# Patient Record
Sex: Male | Born: 1960 | Race: White | Hispanic: No | Marital: Married | Smoking: Current every day smoker
Health system: Southern US, Community
[De-identification: ages and names within clinical notes are randomized; demographics above are authoritative.]

## PROBLEM LIST (undated history)

## (undated) DIAGNOSIS — I10 Essential (primary) hypertension: Secondary | ICD-10-CM

## (undated) DIAGNOSIS — Z720 Tobacco use: Secondary | ICD-10-CM

## (undated) DIAGNOSIS — R002 Palpitations: Secondary | ICD-10-CM

## (undated) DIAGNOSIS — F32A Depression, unspecified: Secondary | ICD-10-CM

## (undated) DIAGNOSIS — I779 Disorder of arteries and arterioles, unspecified: Secondary | ICD-10-CM

## (undated) DIAGNOSIS — Z86711 Personal history of pulmonary embolism: Secondary | ICD-10-CM

## (undated) DIAGNOSIS — E785 Hyperlipidemia, unspecified: Secondary | ICD-10-CM

## (undated) DIAGNOSIS — Z9289 Personal history of other medical treatment: Secondary | ICD-10-CM

## (undated) DIAGNOSIS — I739 Peripheral vascular disease, unspecified: Secondary | ICD-10-CM

## (undated) DIAGNOSIS — I2510 Atherosclerotic heart disease of native coronary artery without angina pectoris: Secondary | ICD-10-CM

## (undated) DIAGNOSIS — F329 Major depressive disorder, single episode, unspecified: Secondary | ICD-10-CM

## (undated) HISTORY — DX: Personal history of other medical treatment: Z92.89

## (undated) HISTORY — DX: Peripheral vascular disease, unspecified: I73.9

## (undated) HISTORY — DX: Palpitations: R00.2

## (undated) HISTORY — DX: Disorder of arteries and arterioles, unspecified: I77.9

## (undated) HISTORY — DX: Atherosclerotic heart disease of native coronary artery without angina pectoris: I25.10

## (undated) HISTORY — PX: OTHER SURGICAL HISTORY: SHX169

## (undated) HISTORY — DX: Tobacco use: Z72.0

## (undated) HISTORY — DX: Personal history of pulmonary embolism: Z86.711

## (undated) HISTORY — DX: Depression, unspecified: F32.A

## (undated) HISTORY — PX: HIP SURGERY: SHX245

## (undated) HISTORY — DX: Hyperlipidemia, unspecified: E78.5

## (undated) HISTORY — DX: Essential (primary) hypertension: I10

## (undated) HISTORY — DX: Major depressive disorder, single episode, unspecified: F32.9

---

## 2006-01-10 ENCOUNTER — Emergency Department (HOSPITAL_COMMUNITY): Admission: EM | Admit: 2006-01-10 | Discharge: 2006-01-10 | Payer: Self-pay | Admitting: Emergency Medicine

## 2006-11-07 ENCOUNTER — Emergency Department (HOSPITAL_COMMUNITY): Admission: EM | Admit: 2006-11-07 | Discharge: 2006-11-07 | Payer: Self-pay | Admitting: *Deleted

## 2007-08-13 DIAGNOSIS — Z86711 Personal history of pulmonary embolism: Secondary | ICD-10-CM

## 2007-08-13 HISTORY — DX: Personal history of pulmonary embolism: Z86.711

## 2008-03-27 ENCOUNTER — Inpatient Hospital Stay (HOSPITAL_COMMUNITY): Admission: EM | Admit: 2008-03-27 | Discharge: 2008-03-29 | Payer: Self-pay | Admitting: Family Medicine

## 2008-03-27 ENCOUNTER — Encounter: Payer: Self-pay | Admitting: Emergency Medicine

## 2008-03-27 ENCOUNTER — Ambulatory Visit: Payer: Self-pay | Admitting: Family Medicine

## 2008-03-28 ENCOUNTER — Encounter: Payer: Self-pay | Admitting: Family Medicine

## 2008-03-28 ENCOUNTER — Ambulatory Visit: Payer: Self-pay | Admitting: Vascular Surgery

## 2008-03-30 ENCOUNTER — Emergency Department (HOSPITAL_COMMUNITY): Admission: EM | Admit: 2008-03-30 | Discharge: 2008-03-30 | Payer: Self-pay | Admitting: Emergency Medicine

## 2009-07-19 ENCOUNTER — Ambulatory Visit: Payer: Self-pay | Admitting: Internal Medicine

## 2009-07-19 ENCOUNTER — Inpatient Hospital Stay (HOSPITAL_COMMUNITY): Admission: EM | Admit: 2009-07-19 | Discharge: 2009-07-21 | Payer: Self-pay | Admitting: Emergency Medicine

## 2009-07-28 ENCOUNTER — Telehealth: Payer: Self-pay | Admitting: Cardiology

## 2009-08-01 ENCOUNTER — Telehealth (INDEPENDENT_AMBULATORY_CARE_PROVIDER_SITE_OTHER): Payer: Self-pay | Admitting: *Deleted

## 2009-08-07 ENCOUNTER — Encounter: Payer: Self-pay | Admitting: Cardiology

## 2009-08-09 ENCOUNTER — Encounter: Payer: Self-pay | Admitting: Cardiology

## 2009-08-16 ENCOUNTER — Ambulatory Visit: Payer: Self-pay | Admitting: Cardiology

## 2009-08-16 DIAGNOSIS — I25118 Atherosclerotic heart disease of native coronary artery with other forms of angina pectoris: Secondary | ICD-10-CM | POA: Insufficient documentation

## 2009-08-24 ENCOUNTER — Telehealth: Payer: Self-pay | Admitting: Cardiology

## 2009-09-18 ENCOUNTER — Ambulatory Visit: Payer: Self-pay | Admitting: Cardiology

## 2009-09-18 ENCOUNTER — Encounter: Payer: Self-pay | Admitting: Cardiology

## 2009-09-18 ENCOUNTER — Ambulatory Visit (HOSPITAL_COMMUNITY): Admission: RE | Admit: 2009-09-18 | Discharge: 2009-09-18 | Payer: Self-pay | Admitting: Cardiology

## 2009-09-18 ENCOUNTER — Ambulatory Visit: Payer: Self-pay

## 2009-09-26 LAB — CONVERTED CEMR LAB
ALT: 27 units/L (ref 0–53)
AST: 20 units/L (ref 0–37)
Albumin: 4.1 g/dL (ref 3.5–5.2)
BUN: 15 mg/dL (ref 6–23)
Bilirubin, Direct: 0 mg/dL (ref 0.0–0.3)
Calcium: 9.2 mg/dL (ref 8.4–10.5)
Sodium: 142 meq/L (ref 135–145)
Total CHOL/HDL Ratio: 3

## 2009-10-30 ENCOUNTER — Telehealth (INDEPENDENT_AMBULATORY_CARE_PROVIDER_SITE_OTHER): Payer: Self-pay | Admitting: *Deleted

## 2009-11-06 ENCOUNTER — Ambulatory Visit: Payer: Self-pay | Admitting: Cardiology

## 2009-11-07 ENCOUNTER — Telehealth: Payer: Self-pay | Admitting: Cardiology

## 2009-11-07 ENCOUNTER — Encounter: Payer: Self-pay | Admitting: Cardiology

## 2009-11-09 ENCOUNTER — Telehealth: Payer: Self-pay | Admitting: Cardiology

## 2009-11-16 ENCOUNTER — Telehealth: Payer: Self-pay | Admitting: Cardiology

## 2009-11-21 ENCOUNTER — Telehealth: Payer: Self-pay | Admitting: Cardiology

## 2009-11-22 ENCOUNTER — Telehealth (INDEPENDENT_AMBULATORY_CARE_PROVIDER_SITE_OTHER): Payer: Self-pay | Admitting: *Deleted

## 2009-12-06 ENCOUNTER — Telehealth (INDEPENDENT_AMBULATORY_CARE_PROVIDER_SITE_OTHER): Payer: Self-pay | Admitting: *Deleted

## 2009-12-07 ENCOUNTER — Telehealth: Payer: Self-pay | Admitting: Cardiology

## 2009-12-11 ENCOUNTER — Encounter: Payer: Self-pay | Admitting: Cardiology

## 2009-12-11 ENCOUNTER — Telehealth (INDEPENDENT_AMBULATORY_CARE_PROVIDER_SITE_OTHER): Payer: Self-pay | Admitting: *Deleted

## 2009-12-13 ENCOUNTER — Telehealth: Payer: Self-pay | Admitting: Cardiology

## 2009-12-28 ENCOUNTER — Ambulatory Visit: Payer: Self-pay | Admitting: Cardiology

## 2010-04-05 ENCOUNTER — Telehealth: Payer: Self-pay | Admitting: Cardiology

## 2010-04-05 ENCOUNTER — Telehealth (INDEPENDENT_AMBULATORY_CARE_PROVIDER_SITE_OTHER): Payer: Self-pay | Admitting: *Deleted

## 2010-04-05 DIAGNOSIS — R079 Chest pain, unspecified: Secondary | ICD-10-CM

## 2010-04-09 ENCOUNTER — Encounter: Payer: Self-pay | Admitting: Cardiology

## 2010-04-09 ENCOUNTER — Telehealth: Payer: Self-pay | Admitting: Cardiology

## 2010-04-09 ENCOUNTER — Ambulatory Visit: Payer: Self-pay

## 2010-04-09 ENCOUNTER — Ambulatory Visit: Payer: Self-pay | Admitting: Cardiology

## 2010-04-09 ENCOUNTER — Encounter (HOSPITAL_COMMUNITY): Admission: RE | Admit: 2010-04-09 | Discharge: 2010-05-03 | Payer: Self-pay | Admitting: Cardiology

## 2010-04-09 LAB — CONVERTED CEMR LAB
BUN: 17 mg/dL (ref 6–23)
Chloride: 105 meq/L (ref 96–112)
Eosinophils Absolute: 0.3 10*3/uL (ref 0.0–0.7)
Eosinophils Relative: 2 % (ref 0.0–5.0)
GFR calc non Af Amer: 102.57 mL/min (ref >60)
INR: 0.9 (ref 0.8–1.0)
Lymphocytes Relative: 15.4 % (ref 12.0–46.0)
Lymphs Abs: 1.9 10*3/uL (ref 0.7–4.0)
Monocytes Absolute: 0.8 10*3/uL (ref 0.1–1.0)
Monocytes Relative: 6.3 % (ref 3.0–12.0)
Platelets: 219 10*3/uL (ref 150.0–400.0)
RBC: 4.94 M/uL (ref 4.22–5.81)
RDW: 14.2 % (ref 11.5–14.6)
WBC: 12.6 10*3/uL — ABNORMAL HIGH (ref 4.5–10.5)

## 2010-04-10 ENCOUNTER — Telehealth (INDEPENDENT_AMBULATORY_CARE_PROVIDER_SITE_OTHER): Payer: Self-pay

## 2010-04-10 ENCOUNTER — Inpatient Hospital Stay (HOSPITAL_COMMUNITY): Admission: EM | Admit: 2010-04-10 | Discharge: 2010-04-11 | Payer: Self-pay | Admitting: Emergency Medicine

## 2010-04-10 ENCOUNTER — Ambulatory Visit: Payer: Self-pay | Admitting: Internal Medicine

## 2010-04-18 ENCOUNTER — Telehealth: Payer: Self-pay | Admitting: Cardiology

## 2010-04-24 ENCOUNTER — Encounter: Payer: Self-pay | Admitting: Cardiology

## 2010-04-24 ENCOUNTER — Telehealth (INDEPENDENT_AMBULATORY_CARE_PROVIDER_SITE_OTHER): Payer: Self-pay | Admitting: *Deleted

## 2010-04-24 ENCOUNTER — Encounter (INDEPENDENT_AMBULATORY_CARE_PROVIDER_SITE_OTHER): Payer: Self-pay | Admitting: *Deleted

## 2010-05-01 ENCOUNTER — Ambulatory Visit: Payer: Self-pay | Admitting: Cardiology

## 2010-07-30 ENCOUNTER — Ambulatory Visit: Payer: Self-pay | Admitting: Cardiology

## 2010-07-30 ENCOUNTER — Encounter: Payer: Self-pay | Admitting: Cardiology

## 2010-09-11 NOTE — Miscellaneous (Signed)
Summary: [facility] Health Care Progress Note  [facility] Health Care Progress Note   Imported By: Roderic Ovens 08/29/2009 13:19:13  _____________________________________________________________________  External Attachment:    Type:   Image     Comment:   External Document

## 2010-09-11 NOTE — Progress Notes (Signed)
Summary: returning call--return to work --pick up Monday  Phone Note Call from Patient Call back at [facility] Phone [phone]   Caller: Patient Reason for Call: Talk to Nurse Summary of Call: returning call  Initial call taken by: Migdalia Dk,  December 07, 2009 10:12 AM  Follow-up for Phone Call        0838am [facility] 1021am   Patient needs forms signed by Monday before he returns to work.  He brought them by the office on 4/27.  Pt is aware Dr. Shirlee Latch will be out of the office until Monday and that his nurse, Thurston Hole, will be in on Friday 4/29.  He said he could pick them up before going to work on Monday.  I will leave message for Thurston Hole.  Follow-up by: Lisabeth Devoid RN,  December 07, 2009 10:28 AM  Additional Follow-up for Phone Call Additional follow up Details #1::        [facility] Katina Dung, RN, BSN  December 08, 2009 8:41 AM  talked with patient--pt wants to pick up form to return to work Monday morning-pt plans to return to work Monday May 2,2011 return to work date is May 1,2011---I have forwarded form to [facility] to be completed and returned to me this afternoon at 3 PM for Dr Shirlee Latch to complete on Monday morning      Additional Follow-up for Phone Call Additional follow up Details #2::    Health Care Provider Fitness Form returned to [employer] for completion. Lenard Forth  December 08, 2009 10:37 AM   Additional Follow-up for Phone Call Additional follow up Details #3:: Details for Additional Follow-up Action Taken: Dr Shirlee Latch signed Health Care Provider Fitness Form -pt to return to work May 1,2011 without restrictions--signed form returned to medical records for pt pick up--pt aware form has been signed by Dr Shirlee Latch and is ready for pick-up in medical records

## 2010-09-11 NOTE — Letter (Signed)
Summary: Generic Letter  Architectural technologist, Main Office  [address]   [place], [place] [address]   Phone: [phone]  Fax: [phone]    12/28/2009  Jesse Page [address] [place], [place]  [address]  [employer]  Mr. Cogbill has been under my care for coronary artery disease. On 07/19/09 he was admitted to [facility] with acute coronary syndrome. On 07/20/09 had PCI of the right coronary artery  with residual high grade lesion in the LAD. Because of his significant residual CAD and the high stress and significant physical demands of his job, I recommended that he remain out of work from December 8,2010 until May 1,2011. I felt that he needed this time for recovery and rehabilitation. Please let me know if you require any further information.    Sincerely,       Dalton McLean,MD

## 2010-09-11 NOTE — Letter (Signed)
Summary: Return To Work  [employer], Main Office  [address]   [place], [place] [address]   Phone: [phone]  Fax: [phone]    08/16/2009  TO: Leodis Sias IT MAY CONCERN   RE: Jesse Page [address] [id number]   The above named individual is under my medical care and may return to work light duty on January 17,2011. He should remain on light duty until he completes Cardiac Rehab.  If you have any further questions or need additional information, please call.     Sincerely,    Dr [name]

## 2010-09-11 NOTE — Progress Notes (Signed)
Summary: Nuc Pre-Procedure  Phone Note Outgoing Call Call back at [facility] Phone [phone]   Call placed by: Antionette Char RN,  April 05, 2010 4:57 PM Call placed to: Patient Reason for Call: Confirm/change Appt Summary of Call: Reviewed information on Myoview Information Sheet (see scanned document for further details).  Spoke with patient.     Nuclear Med Background Indications for Stress Test: Evaluation for Ischemia   History: Echo, Heart Catheterization, Stents   Symptoms: Chest Pain, Palpitations    Nuclear Pre-Procedure Cardiac Risk Factors: Family History - CAD, Smoker Height (in): 71

## 2010-09-11 NOTE — Progress Notes (Signed)
Summary: call from Dr [facility]  Phone Note From Other Clinic   Caller: Dr Earlene Plater Call For: Dr Shirlee Latch Summary of Call: pt  Follow-up for Phone Call        Dr Earlene Plater from [facility] called and talked with Dr Shirlee Latch about pt--he recommended pt be scheduled for stress myoview because of chest pain--LMTCB Katina Dung, RN, BSN  April 05, 2010 12:50 PM   Pt returning call Judie Grieve  April 05, 2010 1:33 PM  Additional Follow-up for Phone Call Additional follow up Details #1::        talked with pt by telephone--reviewed myoview instructions over telephone with pt  New Problems: CHEST PAIN UNSPECIFIED (ICD-786.50)   New Problems: CHEST PAIN UNSPECIFIED (ICD-786.50)

## 2010-09-11 NOTE — Assessment & Plan Note (Signed)
Summary: Cardiology Nuclear Testing  Nuclear Med Background Indications for Stress Test: Evaluation for Ischemia, Stent Patency   History: Echo, Heart Catheterization, Stents  History Comments: Stents in RCA  Symptoms: Chest Pain, Palpitations    Nuclear Pre-Procedure Cardiac Risk Factors: Family History - CAD, Smoker Caffeine/Decaff Intake: None NPO After: 8:00 PM Lungs: clear IV 0.9% NS with Angio Cath: 18G     IV Site: R Antecubital IV Started by: Irean Hong, RN Chest Size (in) 42     Height (in): 71 Weight (lb): 190 BMI: 26.60 Tech Comments: Held coreg 24 hrs.  Nuclear Med Study 1 or 2 day study:  1 day     Stress Test Type:  Stress Reading MD:  Willa Rough, MD     Referring MD:  D.McLean,MD Resting Radionuclide:  Technetium 69m Tetrofosmin     Resting Radionuclide Dose:  11 mCi  Stress Radionuclide:  Technetium 50m Tetrofosmin     Stress Radionuclide Dose:  33 mCi   Stress Protocol Exercise Time (min):  10:16 min     Max HR:  154 bpm     Predicted Max HR:  170 bpm  Max Systolic BP: 186 mm Hg     Percent Max HR:  90.59 %     METS: 12.10 Rate Pressure Product:  72536    Stress Test Technologist:  Cathlyn Parsons, RN     Nuclear Technologist:  Domenic Polite, CNMT  Rest Procedure  Myocardial perfusion imaging was performed at rest 45 minutes following the intravenous administration of Technetium 75m Tetrofosmin.  Stress Procedure  The patient exercised for 10:16.  The patient stopped due to fatigue,SOB and chest tightness.   There were some  non specific ST-T wave changes.  Technetium 84m Tetrofosmin was injected at peak exercise and myocardial perfusion imaging was performed after a brief delay. At the start of recovery, patient  was extremely SOB, pale,lightheaded.  Patients chest tightness was 4-5/10. Rare PAC's and PVC's noted.  Patients symptoms relieved after in recovery. Nuclear study shown to Dr. Shirlee Latch.  Patient being seen by Dr. Shirlee Latch today and  will have a cardiac catherization on  04/13/10.  QPS Raw Data Images:  Patient motion noted; appropriate software correction applied. Stress Images:  Slight decrease in activity at inferior base...insignificant Rest Images:  Same as stress Subtraction (SDS):  No evidence of ischemia. Transient Ischemic Dilatation:  0.94  (Normal <1.22)  Lung/Heart Ratio:  0.35  (Normal <0.45)  Quantitative Gated Spect Images QGS EDV:  120 ml QGS ESV:  59 ml QGS EF:  51 % QGS cine images:  Normal  Findings Normal nuclear study      Overall Impression  Exercise Capacity: Good exercise capacity. BP Response: Normal blood pressure response. Clinical Symptoms: Chest tight 1/10 ECG Impression: No significant ST segment change suggestive of ischemia. Overall Impression Comments: There is no definite scar or ischemia.  Appended Document: Cardiology Nuclear Testing Reviewed raw images, perfusion defect in basal inferior wall.  Had significant chest pain with exertion lasting well into rest. Set up for outpatient cath.

## 2010-09-11 NOTE — Letter (Signed)
Summary: Generic Letter  Architectural technologist, Main Office  [address]   [place], [place] [address]   Phone: [phone]  Fax: [phone]    04/24/2010  Jesse Page [address] [place], [place]  [address]  Dear [name],  You may return to work with full duty on April 25, 2010.       Sincerely,   [name], MD  Appended Document: Generic Letter pt request letter to state return  to work September 19. Letter will be changed and left at front desk for pick-up. Claris Gladden, RN   BSN

## 2010-09-11 NOTE — Letter (Signed)
Summary: Health Care Provider Fitness Duty Form   Health Care Provider Fitness Duty Form   Imported By: Erle Crocker 12/13/2009 09:10:45  _____________________________________________________________________  External Attachment:    Type:   Image     Comment:   External Document

## 2010-09-11 NOTE — Progress Notes (Signed)
Summary: PT RTN CALL FROM YESTERDAY  Phone Note Call from Patient Call back at Home Phone [phone]   Caller: Patient Reason for Call: Talk to Nurse, Talk to Doctor Summary of Call: PT RTN CALL FROM Kristeen Mans Initial call taken by: Omer Jack,  April 10, 2010 10:43 AM  Follow-up for Phone Call        Spoke with pt. Patient states, Thurston Hole had called him back after pt. left the office yesterday. Rn read over the  pre-op instructuction with pt. which he said he had the ionstructions with him. Pt. would like for Thurston Hole to call him back if he needs to know any thing else prior the cath. Follow-up by: Ollen Gross, RN, BSN,  April 10, 2010 11:36 AM

## 2010-09-11 NOTE — Progress Notes (Signed)
Summary: Records faxed to [facility]  Faxed 12 pages to [phone], physicians statement dated 3/28, 2-D echo(09/18/09) and office visits from 08/16/09 and 11/06/09. Spoke to [place] at [phone]. Called patient to inform him that Records and the Physicians Statement dated 11/06/09 were received and is being processed. Rene Kocher Flowers  November 22, 2009 10:24 AM

## 2010-09-11 NOTE — Assessment & Plan Note (Signed)
Summary: f60m/jss   Primary Provider:  Dr. Earlene Plater, [facility]  CC:  check up.  History of Present Illness: 50 yo with h/o smoking and CAD s/p unstable angina with recent PCI to the RCA presents for followup.  Patient was admitted to [facility] in 12/10 with unstable angina-type symptoms.  He was getting chest tightness and shortness of breath with only mild exertion (walking) for a couple of weeks.  Left heart cath showed significant mid RCA stenosis and apical LAD stenosis.  He underwent PCI with Xience DES to the mid RCA.  He has been doing well symptomatically since discharge.  He is no longer going to cardiac rehab.  He has been walking 4 miles a day for exercise.  No exertional chest pain or exertional shortness of breath.  He is not back at work yet (short term disability).  Unfortunately, he is back up to smoking 1 ppd.  He stopped Imdur because of headaches but has not needed nitrates (no chest pain).   Labs (12/10): Creaitnine 1.05, K 4 Labs (2/11): K 4.1, creatinine 1.1, LDL 48, HDL 34, LFTs normal  Current Medications (verified): 1)  Aspir-Trin 325 Mg Tbec (Aspirin) .... One Daily 2)  Plavix 75 Mg Tabs (Clopidogrel Bisulfate) .... One Daily 3)  Coreg 6.25 Mg Tabs (Carvedilol) .... One Twice Daily 4)  Lipitor 40 Mg Tabs (Atorvastatin Calcium) .... One Daily 5)  Lisinopril 10 Mg Tabs (Lisinopril) .... One Daily 6)  Nicoderm Cq 14 Mg/24hr Pt24 (Nicotine) .Marland Kitchen.. 1 Patch Daily 7)  Nitrostat 0.4 Mg Subl (Nitroglycerin) .... As Needed For Chest Pain 8)  Fish Oil 1000 Mg Caps (Omega-3 Fatty Acids) .... Two Capsules Daily  Allergies: No Known Drug Allergies  Past History:  Past Medical History: 1. Ongoing tobacco use.  Using nicotine patch.  Has not tolerated Chantix or Wellbutrin.  2. History of segmental right lower lobe acute pulmonary emboli with no evidence of right heart strain and overall small clot burden.  This was in 2009.  Patient is no longer taking coumadin.  3.  Hyperlipidemia.  4. CAD: Unstable angina 12/10.  LHC showed 90% distal LAD stenosis and 90% mid RCA stenosis.  EF 55%.  Patient had Xience DES to the mid RCA.  Echo (2/11): EF 55-60%, normal wall motion, no significant valvular abnormalities.  5. Palpitations: Prior holter monitor unremarkable per patient's report.  6. HTN  Family History: Reviewed history from 08/15/2009 and no changes required.  Family history of premature coronary artery disease  Social History: Married, lives in [place].  Works as Radiation protection practitioner.  Smokes 1 ppd. Occasional ETOH.   Review of Systems       All systems reviewed and negative except as per HPI.   Vital Signs:  Patient profile:   50 year old male Height:      71 inches Weight:      199 pounds BMI:     27.86 Pulse rate:   64 / minute Resp:     12 per minute BP sitting:   138 / 82  (left arm)  Vitals Entered By: Kem Parkinson (November 06, 2009 8:58 AM)  Physical Exam  General:  Well developed, well nourished, in no acute distress. Neck:  Neck supple, no JVD. No masses, thyromegaly or abnormal cervical nodes. Lungs:  Occasional rhonchi bilaterally Heart:  Non-displaced PMI, chest non-tender; regular rate and rhythm, S1, S2 without murmurs, rubs or gallops. Carotid upstroke normal, no bruit.  Pedals normal pulses. No edema, no  varicosities. Abdomen:  Bowel sounds positive; abdomen soft and non-tender without masses, organomegaly, or hernias noted. No hepatosplenomegaly. Extremities:  No clubbing or cyanosis. Neurologic:  Alert and oriented x 3. Psych:  Normal affect.   Impression & Recommendations:  Problem # 1:  CORONARY ATHEROSCLEROSIS NATIVE CORONARY ARTERY (ICD-414.01) Patient is doing well post-PCI.  He had a Xience DES to the RCA.  He has residual distal LAD disease.  The distal LAD is not a good interventional target.  He is doing well symptomatically with no chest pain and good exercise tolerance.  Continue ASA, Plavix, Coreg, lisinopril,  statin.  EF was preserved on echocardiogram.   Problem # 2:  HYPERCHOLESTEROLEMIA (ICD-272.0) Excellent LDL, HDL a little low.  Increase activity level.   Problem # 3:  SMOKING We had a long discussion about his smoking.  He needs to quit.  He has had adverse reactions to Chantix and Wellbutrin.  He will continue nicotine patches and look into tobacco cessation classes.   Patient Instructions: 1)  Your physician recommends that you continue on your current medications as directed. Please refer to the Current Medication list given to you today. 2)  Your physician wants you to follow-up in: 6 months with Dr Shirlee Latch.  You will receive a reminder letter in the mail two months in advance. If you don't receive a letter, please call our office to schedule the follow-up appointment.

## 2010-09-11 NOTE — Letter (Signed)
Summary: [facility]   Imported By: Marylou Mccoy 08/22/2009 11:48:38  _____________________________________________________________________  External Attachment:    Type:   Image     Comment:   External Document

## 2010-09-11 NOTE — Progress Notes (Signed)
Summary: need note to return to work full time  Phone Note Call from Patient Call back at [facility] Phone [phone]   Caller: Patient Summary of Call: Pt need  note to return back to work full time Initial call taken by: Judie Grieve,  April 24, 2010 8:22 AM  Follow-up for Phone Call        Letter left at front desk for pt.  Claris Gladden, RN, BSM Follow-up by: Claris Gladden RN,  April 24, 2010 9:08 AM

## 2010-09-11 NOTE — Progress Notes (Signed)
Summary: paperwork  Phone Note From Other Clinic Call back at [phone]   Caller: [employer]  Summary of Call: req call back about paperwork claim [phone] Initial call taken by: Migdalia Dk,  November 09, 2009 2:22 PM  Follow-up for Phone Call        Thurston Hole, spoke with Jerilynn Som w/ [facility] he is now requesting: The Doctor's Physical Finding's,Current Restrictions,And if pt has had a Stress test yet.Marland KitchenPlease call Back @ [phone] with these Results... Thanks Loletta Specter Mesiemore  November 09, 2009 3:01 PM talked with Bosie Clos at [facility] claim #[id number] requesting medical records be sent to them --this information should be in the pt's chat and on the form Dr Shirlee Latch completed 10-27-09--I will forward to medical records to follow-up on this Luana Shu  per pt --additional information requested from [facility] Ins--pt to talk with medical records today about getting additional forms from [facility] Ins.  Katina Dung, RN, BSN  November 16, 2009 10:57 AM

## 2010-09-11 NOTE — Assessment & Plan Note (Signed)
Summary: rov   Primary Provider:  Dr. Earlene Plater, [facility]  CC:  rov/no cardiac concerns at this time.  Here to go over insurance paperwork. Pt needs to discuss cause for Out of Work on [employer] forms..  History of Present Illness: 50 yo with h/o smoking and CAD s/p unstable angina with recent PCI to the RCA presents for followup.  Patient was admitted to [facility] in 12/10 with unstable angina-type symptoms.  He was getting chest tightness and shortness of breath with only mild exertion (walking) for a couple of weeks.  Left heart cath showed significant mid RCA stenosis and apical LAD stenosis.  He underwent PCI with Xience DES to the mid RCA.  He has been doing well symptomatically since discharge.  He is no longer going to cardiac rehab.  He has been walking 4 miles a day for exercise.  No exertional chest pain or exertional shortness of breath.  He is not back at work yet (short term disability).  Unfortunately, he is back up to smoking 1 ppd.    Patient has had trouble getting his short term disability and has run out of his meds for the last 3 weeks but did not tell us.  He is only taking aspirin.  BP is 154/89.   Labs (12/10): Creaitnine 1.05, K 4 Labs (2/11): K 4.1, creatinine 1.1, LDL 48, HDL 34, LFTs normal  Current Medications (verified): 1)  Aspir-Trin 325 Mg Tbec (Aspirin) .... One Daily 2)  Plavix 75 Mg Tabs (Clopidogrel Bisulfate) .... One Daily 3)  Coreg 6.25 Mg Tabs (Carvedilol) .... One Twice Daily 4)  Lipitor 40 Mg Tabs (Atorvastatin Calcium) .... One Daily 5)  Lisinopril 10 Mg Tabs (Lisinopril) .... One Daily 6)  Nitrostat 0.4 Mg Subl (Nitroglycerin) .... As Needed For Chest Pain 7)  Fish Oil 1000 Mg Caps (Omega-3 Fatty Acids) .... Two Capsules Daily  Allergies (verified): No Known Drug Allergies  Past History:  Past Medical History: Reviewed history from 11/06/2009 and no changes required. 1. Ongoing tobacco use.  Using nicotine patch.  Has not tolerated  Chantix or Wellbutrin.  2. History of segmental right lower lobe acute pulmonary emboli with no evidence of right heart strain and overall small clot burden.  This was in 2009.  Patient is no longer taking coumadin.  3. Hyperlipidemia.  4. CAD: Unstable angina 12/10.  LHC showed 90% distal LAD stenosis and 90% mid RCA stenosis.  EF 55%.  Patient had Xience DES to the mid RCA.  Echo (2/11): EF 55-60%, normal wall motion, no significant valvular abnormalities.  5. Palpitations: Prior holter monitor unremarkable per patient's report.  6. HTN  Family History: Reviewed history from 08/15/2009 and no changes required.  Family history of premature coronary artery disease  Social History: Reviewed history from 11/06/2009 and no changes required. Married, lives in [place].  Works as Radiation protection practitioner.  Smokes 1 ppd. Occasional ETOH.   Vital Signs:  Patient profile:   50 year old male Height:      71 inches Weight:      194 pounds BMI:     27.16 Pulse rate:   58 / minute Pulse rhythm:   regular BP sitting:   154 / 89  (left arm) Cuff size:   regular  Vitals Entered By: Judithe Modest CMA (Dec 28, 2009 9:18 AM)  Physical Exam  General:  Well developed, well nourished, in no acute distress. Neck:  Neck supple, no JVD. No masses, thyromegaly or abnormal cervical  nodes. Lungs:  Occasional rhonchi bilaterally Heart:  Non-displaced PMI, chest non-tender; regular rate and rhythm, S1, S2 without murmurs, rubs or gallops. Carotid upstroke normal, no bruit.  Pedals normal pulses. No edema, no varicosities. Abdomen:  Bowel sounds positive; abdomen soft and non-tender without masses, organomegaly, or hernias noted. No hepatosplenomegaly. Extremities:  No clubbing or cyanosis. Neurologic:  Alert and oriented x 3. Psych:  Normal affect.   Impression & Recommendations:  Problem # 1:  CORONARY ATHEROSCLEROSIS NATIVE CORONARY ARTERY (ICD-414.01) Patient is doing well symptomatically post-PCI.  He had a  Xience DES to the RCA.  He has residual significant distal LAD disease.  The distal LAD is not a good interventional target.   EF was preserved on echocardiogram.  Patient needs to start back on his cardiac meds.  We will give him 3 wks' worth of samples of Crestor 20 mg daily and Plavix 75 mg daily.  He will take 300 mg of Plavix today.  He is now back at work and will be getting a paycheck soon, at which time he will be able to afford his Plavix and statin copay.  He will get Coreg and lisinopril today as they are generic and inexpensive.   Problem # 2:  SMOKING I strongly counselled the patient to quit.  He plans to use nicotine patches.   Patient Instructions: 1)  Your physician recommends that you schedule a follow-up appointment in: September 2011 with Dr Shirlee Latch. 2)  You can take Crestor 20mg  instead of Lipitor 40mg .

## 2010-09-11 NOTE — Progress Notes (Signed)
Summary: pt needs ins paperwork and wants a call today  Phone Note Call from Patient Call back at Home Phone [phone]   Caller: Patient Reason for Call: Talk to Nurse, Talk to Doctor, Insurance Question Summary of Call: pt ins company faxed paper work Monday and pt is calling to inquire on it because he needs it asap I transfered him to [employer] and they don't know anything about it and he wants to hear something today and he is aware that Thurston Hole is out today Initial call taken by: Omer Jack,  Dec 13, 2009 9:38 AM  Follow-up for Phone Call        I checked with [employer] and medical records . No new paperwork has been received. Pt states [employer] faxed request for additonal information to our office on Monday. Pt will contact insurance company and have them refax information. Pt given fax number --[phone].  Pt states he is having financial issues and needs paperwork completed as soon as possible.  Will forward message to [facility] for follow up. Pt aware Thurston Hole is out of office until tomorrow. Dossie Arbour, RN, BSN  Dec 13, 2009 10:46 AM   Additional Follow-up for Phone Call Additional follow up Details #1::        per [employer]-additional information requested by [facility] [employer] sent 12-20-09

## 2010-09-11 NOTE — Progress Notes (Signed)
  Recieved papers from [facility] ( Attending Physician Statement Of Continued Disability) forwarded to [employer] for processing [facility]  October 30, 2009 8:26 AM

## 2010-09-11 NOTE — Letter (Signed)
Summary: Cardiac Catheterization Instructions- [facility]  [employer], Main Office  [address]   [place], [place] [address]   Phone: [phone]  Fax: [phone]     04/09/2010 MRN: [id number]  REHAAN VILORIA [address] [place], [place]  [address]  Dear Mr. Lara,   You are scheduled for a Cardiac Catheterization on Friday September 2 with Dr. Shirlee Latch.  Please arrive to the 1st floor of the Heart and Vascular Center at [facility] at 10:30 am on the day of your procedure. Please do not arrive before 6:30 a.m. Call the Heart and Vascular Center at [phone] if you are unable to make your appointmnet. The Code to get into the parking garage under the building is 0020. Take the elevators to the 1st floor. You must have someone to drive you home. Someone must be with you for the first 24 hours after you arrive home. Please wear clothes that are easy to get on and off and wear slip-on shoes. Do not eat or drink after midnight except water with your medications that morning. Bring all your medications and current insurance cards with you.   _x__ Make sure you take your aspirin.  _x__ You may take ALL of your medications with water that morning.     The usual length of stay after your procedure is 2 to 3 hours. This can vary.  If you have any questions, please call the office at the number listed above.   Katina Dung, RN, BSN

## 2010-09-11 NOTE — Progress Notes (Signed)
Summary: pt needs disability forms  Phone Note Call from Patient Call back at Home Phone [phone]   Caller: Patient Reason for Call: Talk to Nurse, Talk to Doctor Summary of Call: pt calling because he submitted insrance disability forms and he still has not heard anything and he needs the forms Initial call taken by: Omer Jack,  November 16, 2009 10:22 AM  Follow-up for Phone Call        I talked with patient--pt states [employer] had faxed another form to Dr Shirlee Latch 2 days ago--I have not received this form--Medical Records will talk with pt to get information from pt so additional information can be provided to [facility]      Appended Document: pt needs disability forms I Spoke with Pt--He stated [employer] has been faxing another paper over,I told pt I have not recieved this form, I asked if he knew where it was being sent, He did not He was going to call [employer] and call me back..I also let pt know it might be best if he spoke w/ [employer] concerning this matter.   Appended Document: pt needs disability forms Recieved another Request from [facility] Forwarded to [employer]

## 2010-09-11 NOTE — Progress Notes (Signed)
Summary: disability  Phone Note Call from Patient Call back at Home Phone [phone]   Caller: Patient Reason for Call: Talk to Nurse Summary of Call: request to speak to nurse about disability Initial call taken by: Migdalia Dk,  November 21, 2009 8:05 AM     Spoke with Thurston Hole this am about pt,I told her I will call [employer] about papers,Called and spoke with Renee @ [employer] gave her pt's phone # she will call and handle..km  Appended Document: disability Just spoke w/ Pt... He is a Little upset he has not recieved a call back yet I spoke w/ Renee this Morning @ 9:30 am and no one has still not called pt..I have left another Message for [employer] on the Machine for someone to call pt..ASAP.  Appended Document: disability spoke w/ Pt again this am,he was asking why he did not get a phone call form [employer], I told Kayla to tell pt I will call over to [employer] again and ask someone to call pt,@ 8:30 am Renee ( [employer]) called me and asked if I called [name] I said no I left 2 messages for [employer] on 4/12 for someone to call this pt,Renee said [employer] Contras probably did not have time to call pt.I asked again for someone to call this pt.Marland KitchenMarland Kitchen

## 2010-09-11 NOTE — Assessment & Plan Note (Signed)
Summary: eph/jml   Primary Provider:  Dr. Earlene Plater, [facility]   History of Present Illness: 50 yo with h/o smoking and CAD s/p unstable angina with recent PCI to the RCA presents for followup.  Patient was admitted to [facility] in 12/10 with unstable angina-type symptoms.  He was getting chest tightness and shortness of breath with only mild exertion (walking) for a couple of weeks.  Left heart cath showed significant mid RCA stenosis and apical LAD stenosis.  He underwent PCI with Xience DES to the mid RCA.  Since discharge, he has done well in general.  He has not been very active.  He has had one episode of chest pain, during his pre-cardiac rehab exercise treadmill test.  Otherwise, he has had no chest pain.  He continues to get mild dyspnea with moderate exertion, possibly related to his smoking.  He has cut back to 3-4 cigs/day using nicotine patches.    Labs (12/10): Creaitnine 1.05, K 4  ECG: NSR at 58, normal  Current Medications (verified): 1)  Aspir-Trin 325 Mg Tbec (Aspirin) .... One Daily 2)  Plavix 75 Mg Tabs (Clopidogrel Bisulfate) .... One Daily 3)  Coreg 6.25 Mg Tabs (Carvedilol) .... One Twice Daily 4)  Lipitor 40 Mg Tabs (Atorvastatin Calcium) .... One Daily 5)  Lisinopril 10 Mg Tabs (Lisinopril) .... One Daily 6)  Nicoderm Cq 14 Mg/24hr Pt24 (Nicotine) .Marland Kitchen.. 1 Patch Daily 7)  Nitrostat 0.4 Mg Subl (Nitroglycerin) .... As Needed For Chest Pain 8)  Imdur 30 Mg Xr24h-Tab (Isosorbide Mononitrate) .... One Daily 9)  Fish Oil 1000 Mg Caps (Omega-3 Fatty Acids) .... Two Capsules Daily  Allergies (verified): No Known Drug Allergies  Past History:  Past Medical History: 1. Ongoing tobacco use.  Using nicotine patch.  Has not tolerated Chantix or Wellbutrin.  2. History of segmental right lower lobe acute pulmonary emboli with no evidence of right heart strain and overall small clot burden.  This was in 2009.  Patient is no longer taking coumadin.  3. Hyperlipidemia.   4. CAD: Unstable angina 12/10.  LHC showed 90% distal LAD stenosis and 90% mid RCA stenosis.  EF 55%.  Patient had Xience DES to the mid RCA.  5. Palpitations: Prior holter monitor unremarkable per patient's report.  6. HTN  Family History: Reviewed history from 08/15/2009 and no changes required.  Family history of premature coronary artery disease  Social History: Married, lives in [place].  Works as Radiation protection practitioner.  Smoked 1 ppd, now down to 3-4 cigs/day with nicotine patch. Occasional ETOH.   Review of Systems       All systems reviewed and negative except as per HPI.   Vital Signs:  Patient profile:   50 year old male Height:      71 inches Weight:      203.25 pounds BMI:     28.45 Pulse rate:   58 / minute BP sitting:   138 / 78  (right arm)  Physical Exam  General:  Well developed, well nourished, in no acute distress. Head:  normocephalic and atraumatic Nose:  no deformity, discharge, inflammation, or lesions Mouth:  Teeth, gums and palate normal. Oral mucosa normal. Neck:  Neck supple, no JVD. No masses, thyromegaly or abnormal cervical nodes. Lungs:  Clear bilaterally to auscultation and percussion. Heart:  Non-displaced PMI, chest non-tender; regular rate and rhythm, S1, S2 without murmurs, rubs or gallops. Carotid upstroke normal, no bruit.  Pedals normal pulses. No edema, no varicosities. Abdomen:  Bowel sounds positive; abdomen soft and non-tender without masses, organomegaly, or hernias noted. No hepatosplenomegaly. Msk:  Back normal, normal gait. Muscle strength and tone normal. Extremities:  No clubbing or cyanosis. Neurologic:  Alert and oriented x 3. Skin:  Intact without lesions or rashes. Psych:  Normal affect.   Impression & Recommendations:  Problem # 1:  CORONARY ATHEROSCLEROSIS NATIVE CORONARY ARTERY (ICD-414.01) Patient is doing reasonably well post-PCI.  He had a Xience DES to the RCA.  He has residual distal LAD disease that may be the source of  his anginal-type chest pain while on the treadmill recently.  He has only had one episode of chest pain. The distal LAD is not a good interventional target so will plan aggressive medical management.  I will add Imdur 30 mg daily to his regimen today.  He will continue ASA, Plavix, Coreg, lisinopril, and statin. EF was preserved on LV-gram with cath.  I will have him come back for an echo in about 1 month.   Problem # 2:  HYPERCHOLESTEROLEMIA (ICD-272.0) Check lipids/LFTs in 1 month.   Problem # 3:  SMOKING Working on quitting.  Cutting back with patch.  Has not tolerated Chantix or Wellbutrin.   Other Orders: Echocardiogram (Echo)  Patient Instructions: 1)  Your physician has recommended you make the following change in your medication:  2)  Start Imdur 30mg  daily 3)  Start Fish OIl 2000mg  daily--this will be two 1000mg  capsules 4)  Your physician recommends that you return for a FASTING lipid profile/liver profile/BMP in 1 month 5)  Your physician has requested that you have an echocardiogram.  Echocardiography is a painless test that uses sound waves to create images of your heart. It provides your doctor with information about the size and shape of your heart and how well your heart's chambers and valves are working.  This procedure takes approximately one hour. There are no restrictions for this procedure.   in 1 month at time of fasting lab work 6)  Your physician recommends that you schedule a follow-up appointment in: 3 months with Dr. [name]  Prescriptions: IMDUR 30 MG XR24H-TAB (ISOSORBIDE MONONITRATE) one daily  #30 x 6   Entered by:   Katina Dung, RN, BSN   Authorized by:   [name], MD   Signed by:   Katina Dung, RN, BSN on 08/16/2009   Method used:   Electronically to        [facility] [phone]* (retail)       [address] [place], [place]  [address]       Ph: [phone]       Fax: [phone]   RxID:   [id number]

## 2010-09-11 NOTE — Progress Notes (Signed)
       Additional Follow-up for Phone Call Additional follow up Details #3:: Details for Additional Follow-up Action Taken: Pt picked up Health Care Provider Form thsi Am.. Additional Follow-up by: Nadean Corwin

## 2010-09-11 NOTE — Assessment & Plan Note (Signed)
Summary: Jesse Page   Primary Jesse Page:  Dr. Earlene Plater, [facility]   History of Present Illness: 50 yo with h/o smoking and CAD s/p unstable angina with PCI to the RCA presents for followup.  Patient was admitted to [facility] in 12/10 with unstable angina-type symptoms.  He was getting chest tightness and shortness of breath with only mild exertion (walking) for a couple of weeks.  Left heart cath showed significant mid RCA stenosis and apical LAD stenosis.  He underwent PCI with Xience DES to the mid RCA.    Recently, patient developed chest tightness that was both exertional and nonexertional.  The pain was relieved by NTG.  He was set up for an ETT-myoview that showed probable inferior attenuation but he developed severe chest pain on the treadmill and it was decided to cath him.  Left heart cath (04/11/10) showed moderate, non-flow limiting disease in the LAD and mild in-stent restenosis in the RCA.  No intervention.  Patient today reports no chest pain for the last 2 weeks.  He thinks that anxiety may have played a role in his symptoms.  He has had no orthostatic-type symptoms since decreasing his Coreg.   Labs (12/10): Creaitnine 1.05, K 4 Labs (2/11): K 4.1, creatinine 1.1, LDL 48, HDL 34, LFTs normal Labs (9/62): K 3.6, creatinine 1.0, LDL 56, HDL 33, TGs 66  ECG: NSR, normal  Current Medications (verified): 1)  Aspir-Trin 325 Mg Tbec (Aspirin) .... One Daily 2)  Plavix 75 Mg Tabs (Clopidogrel Bisulfate) .... One Daily 3)  Coreg 6.25 Mg Tabs (Carvedilol) .... 1/2 Two Times A Day 4)  Crestor 20 Mg Tabs (Rosuvastatin Calcium) .Marland Kitchen.. 1 By Mouth Daily 5)  Lisinopril 10 Mg Tabs (Lisinopril) .... One Daily 6)  Nitrostat 0.4 Mg Subl (Nitroglycerin) .... As Needed For Chest Pain 7)  Fish Oil 1000 Mg Caps (Omega-3 Fatty Acids) .... Two Capsules Daily 8)  Citalopram Hydrobromide 20 Mg Tabs (Citalopram Hydrobromide) .Marland Kitchen.. 1 By Mouth Daily  Allergies (verified): 1)  ! Penicillin  Past  History:  Past Medical History: 1. Ongoing tobacco use.  Using nicotine patch.  Has not tolerated Chantix or Wellbutrin.  2. History of segmental right lower lobe acute pulmonary emboli with no evidence of right heart strain and overall small clot burden.  This was in 2009.  Patient is no longer taking coumadin.  3. Hyperlipidemia.  4. CAD: Unstable angina 12/10.  LHC showed 90% distal LAD stenosis and 90% mid RCA stenosis.  EF 55%.  Patient had Xience DES to the mid RCA.  Echo (2/11): EF 55-60%, normal wall motion, no significant valvular abnormalities.  Repeat cath (8/11) showed 50-60% proximal LAD stenosis, 40% mid LAD stenosis, 40-50% mid RCA in-stent restenosis, EF 60%.  5. Palpitations: Prior holter monitor unremarkable per patient's report.  6. HTN 7. Depression  Family History: Reviewed history from 08/15/2009 and no changes required.  Family history of premature coronary artery disease  Social History: Reviewed history from 11/06/2009 and no changes required. Married, lives in [place].  Works as Radiation protection practitioner.  Smokes 1 ppd. Occasional ETOH.   Review of Systems       All systems reviewed and negative except as per HPI.   Vital Signs:  Patient profile:   50 year old male Height:      71 inches Weight:      199 pounds BMI:     27.86 Pulse rate:   56 / minute Resp:     16 per minute  BP sitting:   109 / 76  (right arm)  Vitals Entered By: Marrion Coy, CNA (May 01, 2010 9:02 AM)  Physical Exam  General:  Well developed, well nourished, in no acute distress. Neck:  Neck supple, no JVD. No masses, thyromegaly or abnormal cervical nodes. Lungs:  Clear bilaterally to auscultation and percussion. Heart:  Non-displaced PMI, chest non-tender; regular rate and rhythm, S1, S2 without murmurs, rubs or gallops. Carotid upstroke normal, no bruit.  Pedals normal pulses. No edema, no varicosities. Abdomen:  Bowel sounds positive; abdomen soft and non-tender without masses,  organomegaly, or hernias noted. No hepatosplenomegaly. Extremities:  No clubbing or cyanosis. Neurologic:  Alert and oriented x 3. Psych:  Normal affect.   Impression & Recommendations:  Problem # 1:  CORONARY ATHEROSCLEROSIS NATIVE CORONARY ARTERY (ICD-414.01) Status post DES to the RCA.  Recent chest pain with typical and atypical features prompted repeat cath in 8/11, showing moderate nonobstructive proximal LAD disease and mild in-stent restenosis in the RCA.  It is possible that the patient's chest pain was due to microvascular angina in the setting of ongoing smoking.  He is not able to tolerate Imdur due to headaches.  Would continue current regimen as he has been pain-free for 2 week.  If he continues to have episodes of typical chest pain, could try ranolazine as this may help in the setting of microvascular angina.  He has become orthostatic with uptitration of beta blocker.   Problem # 2:  HYPERCHOLESTEROLEMIA (ICD-272.0) LDL at goal in 8/11.   Problem # 3:  SMOKING Counselled him again to quit.  I gave him the number for the smoking cessation classes at [facility].  He has not had success with Wellbutrin or Chantix.  He has tried nicotine patch without success.  I suggested that he try the electronic cigarette.   Patient Instructions: 1)  Your physician recommends that you schedule a follow-up appointment in: 3 months with Dr Shirlee Latch. 2)  Your physician discussed the hazards of tobacco use.  Tobacco use cessation is recommended and techniques and options to help you quit were discussed.  You have the prescription for the electronic cigarette.

## 2010-09-11 NOTE — Progress Notes (Signed)
Summary: med questions  Phone Note Call from Patient Call back at Home Phone [phone]   Caller: Patient Reason for Call: Talk to Nurse Summary of Call: has some questions  about imdur...giving him headaches, on daily ASA, needs to know dosage Initial call taken by: Migdalia Dk,  August 24, 2009 1:29 PM  Follow-up for Phone Call        talked with patient--pt c/o headache with Imdur--pt states he does not feel like he can tolerate the Imdur because of the headache--I reviewed with Dr Anyelo Mccue--OK for pt to D/C Imdur,use NTG as needed--I talked with patient        Current Medications (verified): 1)  Aspir-Trin 325 Mg Tbec (Aspirin) .... One Daily 2)  Plavix 75 Mg Tabs (Clopidogrel Bisulfate) .... One Daily 3)  Coreg 6.25 Mg Tabs (Carvedilol) .... One Twice Daily 4)  Lipitor 40 Mg Tabs (Atorvastatin Calcium) .... One Daily 5)  Lisinopril 10 Mg Tabs (Lisinopril) .... One Daily 6)  Nicoderm Cq 14 Mg/24hr Pt24 (Nicotine) .Marland Kitchen.. 1 Patch Daily 7)  Nitrostat 0.4 Mg Subl (Nitroglycerin) .... As Needed For Chest Pain 8)  Fish Oil 1000 Mg Caps (Omega-3 Fatty Acids) .... Two Capsules Daily  Allergies: No Known Drug Allergies

## 2010-09-11 NOTE — Progress Notes (Signed)
  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

## 2010-09-11 NOTE — Letter (Signed)
Summary: [facility]   Imported By: Marylou Mccoy 01/03/2010 14:35:22  _____________________________________________________________________  External Attachment:    Type:   Image     Comment:   External Document

## 2010-09-11 NOTE — Miscellaneous (Signed)
Summary: Orders Update  Clinical Lists Changes  Orders: Added new Test order of TLB-BMP (Basic Metabolic Panel-BMET) (80048-METABOL) - Signed Added new Test order of TLB-CBC Platelet - w/Differential (85025-CBCD) - Signed Added new Test order of TLB-PT (Protime) (85610-PTP) - Signed 

## 2010-09-11 NOTE — Progress Notes (Signed)
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 cath(04/11/10) with no lifting over 15# between now and then with right hand--pt agrees--nt Follow-up by: Ledon Snare, RN,  April 18, 2010 10:33 AM

## 2010-09-11 NOTE — Progress Notes (Signed)
Summary: contact insurance  Phone Note Call from Patient Call back at Home Phone [phone]   Caller: Patient Reason for Call: Talk to Nurse Summary of Call: need our office to contact insurance and give them confirmation that he is still out of work, [employer] [phone] claim number [id number] Initial call taken by: Migdalia Dk,  November 07, 2009 9:11 AM  Follow-up for Phone Call        Would like to follow up phone call with [facility] yesterday.   Follow-up by: Burnard Leigh,  November 08, 2009 9:37 AM    Additional Follow-up for Phone Call Additional follow up Details #2::    Mr Jesse Page states we need to call insurance company to let them know he's still not working so they can release his payment to him.   I called insurance company  they state they had faxed a form to [place]  requesting medical information to support him being out of work.  They are awaiting results.   Asked they fax that to [place] today.  Awaiting fax.    Follow-up by: Letta Moynahan, EMT,  November 07, 2009 9:27 AM  Spoke Lovenia Kim, she asked [place] to Follow up w/ Pt, I called [employer] and asked Renee to call pt and follow up with him on his Short Term Disability for work.Milinda Pointer  Appended Document: contact insurance Spoke with Jesse Page this am around 10 am, he was asking that the correct records be faxed over to [facility],I got the [facility] from [place] to fax the records,Records were faxed over to [employer] to Attn: Gwendel Hanson to fax [phone], made a copy of the paper and Mailed out to the patient. km

## 2010-09-11 NOTE — Progress Notes (Signed)
Summary: schedule cath  Phone Note Outgoing Call   Call placed by: Katina Dung, RN, BSN,  April 09, 2010 12:48 PM Call placed to: Patient Summary of Call: schedule for cath  Follow-up for Phone Call        per Dr Adrian Prows for JV cath 04/13/10 radial

## 2010-09-11 NOTE — Letter (Signed)
Summary: Generic Letter  Arch[employer]ural [employer], Main Office  [address]   [place], [place] [address]   Phone: [phone]  Fax: [phone]    04/24/2010  RAESHAUN SIMSON [address] [place], [place]  [address]  Dear Mr. Boettger,   You may return to work with full duty on April 30, 2010.       Sincerely,    [name], MD

## 2010-09-13 NOTE — Assessment & Plan Note (Signed)
Summary: PER CHECK OUT/SF   Primary Provider:  Dr. Earlene Plater, [facility]  CC:  check up.  History of Present Illness: 50 yo with h/o smoking and CAD s/p unstable angina with PCI to the RCA presents for followup.  Patient was admitted to [facility] in 12/10 with unstable angina-type symptoms.  He was getting chest tightness and shortness of breath with only mild exertion (walking) for a couple of weeks.  Left heart cath showed significant mid RCA stenosis and apical LAD stenosis.  He underwent PCI with Xience DES to the mid RCA.    This summer, patient developed chest tightness that was both exertional and nonexertional.  The pain was relieved by NTG.  He was set up for an ETT-myoview that showed probable inferior attenuation but he developed severe chest pain on the treadmill and it was decided to cath him.  Left heart cath (04/11/10) showed moderate, non-flow limiting disease in the LAD and mild in-stent restenosis in the RCA.  No intervention.  Patient has had no further chest pain since his cath this summer.  He is doing well in general. He quit smoking this month.   Labs (12/10): Creaitnine 1.05, K 4 Labs (2/11): K 4.1, creatinine 1.1, LDL 48, HDL 34, LFTs normal Labs (1/61): K 3.6, creatinine 1.0, LDL 56, HDL 33, TGs 66  ECG: NSR, normal  Current Medications (verified): 1)  Aspir-Trin 325 Mg Tbec (Aspirin) .... One Daily 2)  Plavix 75 Mg Tabs (Clopidogrel Bisulfate) .... One Daily 3)  Coreg 6.25 Mg Tabs (Carvedilol) .... 1/2 Two Times A Day 4)  Crestor 20 Mg Tabs (Rosuvastatin Calcium) .Marland Kitchen.. 1 By Mouth Daily 5)  Lisinopril 10 Mg Tabs (Lisinopril) .... One Daily 6)  Nitrostat 0.4 Mg Subl (Nitroglycerin) .... As Needed For Chest Pain 7)  Fish Oil 1000 Mg Caps (Omega-3 Fatty Acids) .... Two Capsules Daily 8)  Citalopram Hydrobromide 20 Mg Tabs (Citalopram Hydrobromide) .Marland Kitchen.. 1 By Mouth Daily  Allergies: 1)  ! Penicillin  Past History:  Past Medical History: 1. Prior smoker,  quit 12/11 2. History of segmental right lower lobe acute pulmonary emboli with no evidence of right heart strain and overall small clot burden.  This was in 2009.  Patient is no longer taking coumadin.  3. Hyperlipidemia.  4. CAD: Unstable angina 12/10.  LHC showed 90% distal LAD stenosis and 90% mid RCA stenosis.  EF 55%.  Patient had Xience DES to the mid RCA.  Echo (2/11): EF 55-60%, normal wall motion, no significant valvular abnormalities.  Repeat cath (8/11) showed 50-60% proximal LAD stenosis, 40% mid LAD stenosis, 40-50% mid RCA in-stent restenosis, EF 60%.  5. Palpitations: Prior holter monitor unremarkable per patient's report.  6. HTN 7. Depression  Family History: Reviewed history from 08/15/2009 and no changes required.  Family history of premature coronary artery disease  Social History: Reviewed history from 11/06/2009 and no changes required. 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 Occasional ETOH.   Vital Signs:  Patient profile:   50 year old male Height:      71 inches Weight:      194 pounds BMI:     27.16 Pulse rate:   59 / minute Resp:     14 per minute BP sitting:   120 / 72  (left arm)  Vitals Entered By: Kem Parkinson (July 30, 2010 8:57 AM)  Physical Exam  General:  Well developed, well nourished, in  no acute distress. Neck:  Neck supple, no JVD. No masses, thyromegaly or abnormal cervical nodes. Lungs:  Clear bilaterally to auscultation and percussion. Heart:  Non-displaced PMI, chest non-tender; regular rate and rhythm, S1, S2 without murmurs, rubs or gallops. Carotid upstroke normal, no bruit.  Pedals normal pulses. No edema, no varicosities. Abdomen:  Bowel sounds positive; abdomen soft and non-tender without masses, organomegaly, or hernias noted. No hepatosplenomegaly. Extremities:  No clubbing or cyanosis. Neurologic:  Alert and oriented x 3. Psych:  Normal affect.   Impression &  Recommendations:  Problem # 1:  CORONARY ATHEROSCLEROSIS NATIVE CORONARY ARTERY (ICD-414.01) Status post DES to the RCA.  Recurrent chest pain with typical and atypical features prompted repeat cath in 8/11, showing moderate nonobstructive proximal LAD disease and mild in-stent restenosis in the RCA.  It is possible that the patient's chest pain was due to microvascular angina in the setting of smoking.  He has had no chest pain since 8/11 and has quit smoking.  Continue current regimen with ASA, Plavix, Coreg, lisinopril, and Crestor.   Problem # 2:  HYPERCHOLESTEROLEMIA (ICD-272.0) Needs lipids/LFTs in 2/11.   Patient Instructions: 1)  Your physician has recommended you make the following change in your medication:  2)  Decrease Aspirin to 81mg  daily. 3)  Fasting lipid/liver profile in February 2012--you have the order. 414.01 4)  Your physician wants you to follow-up in: 6 months with Dr Shirlee Latch.Joycie Peek 2012)  You will receive a reminder letter in the mail two months in advance. If you don't receive a letter, please call our office to schedule the follow-up appointment.

## 2010-09-14 ENCOUNTER — Encounter: Payer: Self-pay | Admitting: Cardiology

## 2010-10-26 LAB — LIPID PANEL
Cholesterol: 102 mg/dL (ref 0–200)
HDL: 33 mg/dL — ABNORMAL LOW (ref >39)
LDL Cholesterol: 56 mg/dL (ref 0–99)

## 2010-10-26 LAB — DIFFERENTIAL
Basophils Relative: 0 % (ref 0–1)
Eosinophils Absolute: 0.3 10*3/uL (ref 0.0–0.7)
Lymphocytes Relative: 30 % (ref 12–46)
Lymphs Abs: 3 10*3/uL (ref 0.7–4.0)
Monocytes Absolute: 0.9 10*3/uL (ref 0.1–1.0)
Neutro Abs: 6 10*3/uL (ref 1.7–7.7)
Neutrophils Relative %: 59 % (ref 43–77)

## 2010-10-26 LAB — COMPREHENSIVE METABOLIC PANEL
Alkaline Phosphatase: 57 U/L (ref 39–117)
CO2: 24 mEq/L (ref 19–32)
Calcium: 9.3 mg/dL (ref 8.4–10.5)
Chloride: 107 mEq/L (ref 96–112)
GFR calc Af Amer: >60 mL/min (ref >60)
Potassium: 3.6 mEq/L (ref 3.5–5.1)
Total Protein: 6.6 g/dL (ref 6.0–8.3)

## 2010-10-26 LAB — CARDIAC PANEL(CRET KIN+CKTOT+MB+TROPI)
Relative Index: 1.5 (ref 0.0–2.5)
Total CK: 134 U/L (ref 7–232)
Total CK: 136 U/L (ref 7–232)
Troponin I: 0.01 ng/mL (ref 0.00–0.06)
Troponin I: 0.01 ng/mL (ref 0.00–0.06)

## 2010-10-26 LAB — CBC
Hemoglobin: 15.9 g/dL (ref 13.0–17.0)
MCV: 95.2 fL (ref 78.0–100.0)
RBC: 4.82 MIL/uL (ref 4.22–5.81)
RDW: 13.5 % (ref 11.5–15.5)

## 2010-10-26 LAB — APTT: aPTT: 27 seconds (ref 24–37)

## 2010-10-26 LAB — PROTIME-INR: INR: 0.92 (ref 0.00–1.49)

## 2010-10-26 LAB — CK TOTAL AND CKMB (NOT AT ARMC): Relative Index: 1.5 (ref 0.0–2.5)

## 2010-11-13 LAB — CARDIAC PANEL(CRET KIN+CKTOT+MB+TROPI)
CK, MB: 1.6 ng/mL (ref 0.3–4.0)
CK, MB: 2.1 ng/mL (ref 0.3–4.0)
Relative Index: 0.7 (ref 0.0–2.5)
Total CK: 215 U/L (ref 7–232)
Total CK: 304 U/L — ABNORMAL HIGH (ref 7–232)
Troponin I: 0.02 ng/mL (ref 0.00–0.06)

## 2010-11-13 LAB — BASIC METABOLIC PANEL
BUN: 9 mg/dL (ref 6–23)
CO2: 26 mEq/L (ref 19–32)
Calcium: 8.9 mg/dL (ref 8.4–10.5)
Calcium: 9.3 mg/dL (ref 8.4–10.5)
Chloride: 104 mEq/L (ref 96–112)
Creatinine, Ser: 1.05 mg/dL (ref 0.4–1.5)
Creatinine, Ser: 1.06 mg/dL (ref 0.4–1.5)
GFR calc Af Amer: >60 mL/min (ref >60)
GFR calc Af Amer: >60 mL/min (ref >60)
GFR calc non Af Amer: >60 mL/min (ref >60)
Glucose, Bld: 96 mg/dL (ref 70–99)
Sodium: 138 mEq/L (ref 135–145)
Sodium: 139 mEq/L (ref 135–145)

## 2010-11-13 LAB — LIPID PANEL
HDL: 30 mg/dL — ABNORMAL LOW (ref >39)
LDL Cholesterol: 129 mg/dL — ABNORMAL HIGH (ref 0–99)
Total CHOL/HDL Ratio: 6.7 RATIO
Triglycerides: 209 mg/dL — ABNORMAL HIGH (ref <150)
VLDL: 42 mg/dL — ABNORMAL HIGH (ref 0–40)

## 2010-11-13 LAB — URINALYSIS, ROUTINE W REFLEX MICROSCOPIC
Protein, ur: NEGATIVE mg/dL
Urobilinogen, UA: 0.2 mg/dL (ref 0.0–1.0)

## 2010-11-13 LAB — CBC
HCT: 45.7 % (ref 39.0–52.0)
Hemoglobin: 15.7 g/dL (ref 13.0–17.0)
Hemoglobin: 15.7 g/dL (ref 13.0–17.0)
Hemoglobin: 17.7 g/dL — ABNORMAL HIGH (ref 13.0–17.0)
MCHC: 34.2 g/dL (ref 30.0–36.0)
MCV: 95.4 fL (ref 78.0–100.0)
RBC: 4.79 MIL/uL (ref 4.22–5.81)
RBC: 5.34 MIL/uL (ref 4.22–5.81)
RDW: 13.6 % (ref 11.5–15.5)
WBC: 6.9 10*3/uL (ref 4.0–10.5)

## 2010-11-13 LAB — BRAIN NATRIURETIC PEPTIDE: Pro B Natriuretic peptide (BNP): <30 pg/mL (ref 0.0–100.0)

## 2010-11-13 LAB — POCT I-STAT 3, ART BLOOD GAS (G3+)
Bicarbonate: 22.5 mEq/L (ref 20.0–24.0)
TCO2: 24 mmol/L (ref 0–100)
pCO2 arterial: 37.8 mmHg (ref 35.0–45.0)
pH, Arterial: 7.383 (ref 7.350–7.450)

## 2010-11-13 LAB — D-DIMER, QUANTITATIVE: D-Dimer, Quant: 0.4 ug/mL-FEU (ref 0.00–0.48)

## 2010-11-13 LAB — DIFFERENTIAL
Eosinophils Absolute: 0.2 10*3/uL (ref 0.0–0.7)
Lymphs Abs: 2.3 10*3/uL (ref 0.7–4.0)
Monocytes Relative: 6 % (ref 3–12)
Neutrophils Relative %: 65 % (ref 43–77)

## 2010-11-13 LAB — HEMOGLOBIN A1C: Hgb A1c MFr Bld: 6 % (ref 4.6–6.1)

## 2010-11-13 LAB — POCT I-STAT 3, VENOUS BLOOD GAS (G3P V)
pCO2, Ven: 42.3 mmHg — ABNORMAL LOW (ref 45.0–50.0)
pO2, Ven: 38 mmHg (ref 30.0–45.0)

## 2010-11-13 LAB — POCT CARDIAC MARKERS: Myoglobin, poc: 157 ng/mL (ref 12–200)

## 2010-11-13 LAB — PROTIME-INR: INR: 0.93 (ref 0.00–1.49)

## 2010-12-25 NOTE — Discharge Summary (Signed)
NAMECHLOE, Page             ACCOUNT NO.:  [id number]   MEDICAL RECORD NO.:  [id number]          PATIENT TYPE:  INP   LOCATION:  4739                         FACILITY:  [facility]   PHYSICIAN:  Wayne A. Sheffield Slider, M.D.    DATE OF BIRTH:  [birth year]   DATE OF ADMISSION:  03/27/2008  DATE OF DISCHARGE:  03/29/2008                               DISCHARGE SUMMARY   PRIMARY CARE Shizuko Wojdyla:  Dr. Earlene Plater at [facility].   DISCHARGE DIAGNOSES:  1. Pulmonary embolism.  2. Tobacco abuse.   DISCHARGE MEDICATIONS:  1. Coumadin 10 mg p.o. daily, given 30 tablets.  2. Enoxaparin 90 mg q.12 h. x3 days.  3. Chantix starter kit according to the manufacturers instructions.   STUDIES:  CT angiogram of the chest showed a right lower lobe pulmonary  embolism.  Lower extremity Doppler showed left popliteal DVT.   INR 1.0 on admission, and INR 1.1 at discharge.  BMET within normal  limits on admission.  Hypercoagulability panel was drawn, the results of  which are pending at the time of this dication.   SUMMARY OF STATEMENT:  This is a 50 year old male with pulmonary  embolism.   BRIEF HOSPITAL COURSE:  1. On admission, the patient was short of breath with chest pain on      the right side.  The patient was started on Lovenox bridge to      Coumadin.  INR was 1.0 on admission, it had changed slightly to 1.1      by discharge, became asymptomatic  without shortness of breath and      more of chest pain during the day.  Hypercoagulability panel does      not show an abnormalities other than slightly elevated      homocysteine.  Doppler on the lower extremity showed DVT popliteal      vein at the left leg because he was knowledgeable, seemed to be      compliant.  He was discharged on Lovenox and Coumadin with orders      to follow with the [facility] within 2 days for      adjustment of his warfarin dose.  He was told to come back to      emergency department if he is having shortness  of breath or chest      pain.  2. Tobacco abuse.  The patient was informed of the effects of smoking      vascularly and to his overall health.  He is interested in quitting      and was given Chantix on discharge.  3. Hypertension.  Blood pressure was not evolved during his stay.      Sytolic in 150s.  We will need to be followed this as an outpatient      setting.   DISCHARGE INSTRUCTIONS:  Heart-healthy diet.  The patient should return  to emergency department if become suddenly short of breath or has  increased chest pain.  The patient should stop smoking.  Followup  appointment  within 2 days to [facility] for INR check.  Follow  up with Dr.  Onalee Hua at [facility] on April 07, 2008, at 1:45.   DISCHARGE CONDITION:  The patient was discharged home in stable medical  condition.      Sylvan Cheese, M.D.  Electronically Signed      Wayne A. Sheffield Slider, M.D.  Electronically Signed    MJ/MEDQ  D:  03/29/2008  T:  03/30/2008  Job:  [id number]   cc:   Carmin Richmond, MD

## 2011-01-23 ENCOUNTER — Other Ambulatory Visit: Payer: Self-pay | Admitting: *Deleted

## 2011-01-23 MED ORDER — ROSUVASTATIN CALCIUM 40 MG PO TABS: 20.0000 mg | ORAL_TABLET | Freq: Every day | ORAL | Status: DC

## 2011-02-20 ENCOUNTER — Encounter: Payer: Self-pay | Admitting: Cardiology

## 2011-02-21 ENCOUNTER — Encounter: Payer: Self-pay | Admitting: Cardiology

## 2011-02-21 ENCOUNTER — Ambulatory Visit (INDEPENDENT_AMBULATORY_CARE_PROVIDER_SITE_OTHER): Payer: 59 | Admitting: Cardiology

## 2011-02-21 DIAGNOSIS — E7800 Pure hypercholesterolemia, unspecified: Secondary | ICD-10-CM

## 2011-02-21 DIAGNOSIS — R079 Chest pain, unspecified: Secondary | ICD-10-CM

## 2011-02-21 DIAGNOSIS — F17200 Nicotine dependence, unspecified, uncomplicated: Secondary | ICD-10-CM

## 2011-02-21 DIAGNOSIS — R002 Palpitations: Secondary | ICD-10-CM

## 2011-02-21 DIAGNOSIS — I2510 Atherosclerotic heart disease of native coronary artery without angina pectoris: Secondary | ICD-10-CM

## 2011-02-21 LAB — HEPATIC FUNCTION PANEL
ALT: 26 U/L (ref 0–53)
Alkaline Phosphatase: 52 U/L (ref 39–117)
Bilirubin, Direct: 0.2 mg/dL (ref 0.0–0.3)
Total Protein: 7.1 g/dL (ref 6.0–8.3)

## 2011-02-21 LAB — LIPID PANEL: Cholesterol: 121 mg/dL (ref 0–200)

## 2011-02-21 NOTE — Patient Instructions (Signed)
Fasting lab today--lipid profile/liver profile 786.50  414.01  Schedule an appointment for a stress myoview--see instruction sheet.  Schedule an appointment with Dr Shirlee Latch in 2-3 weeks.

## 2011-02-22 DIAGNOSIS — F17210 Nicotine dependence, cigarettes, uncomplicated: Secondary | ICD-10-CM | POA: Insufficient documentation

## 2011-02-22 NOTE — Assessment & Plan Note (Signed)
Check lipids/LFTs today.  Goal LDL < 70.  

## 2011-02-22 NOTE — Progress Notes (Signed)
PCP: Dr. Earlene Plater [facility])  50 yo with h/o smoking and CAD s/p unstable angina with PCI to the RCA presents for followup. Patient was admitted to [facility] in 12/10 with unstable angina-type symptoms. He was getting chest tightness and shortness of breath with only mild exertion (walking) for a couple of weeks. Left heart cath showed significant mid RCA stenosis and apical LAD stenosis. He underwent PCI with Xience DES to the mid RCA.  Last summer, patient developed chest tightness that was both exertional and nonexertional. The pain was relieved by NTG. He was set up for an ETT-myoview that showed probable inferior attenuation but he developed severe chest pain on the treadmill and it was decided to cath him. Left heart cath (04/11/10) showed moderate, non-flow limiting disease in the LAD and mild in-stent restenosis in the RCA. No intervention.   Initially after last summer, he did well.  However, over the last 2-3 months, he has re-developed chest pain.  He has noted chest tightness and shortness of breath with heavier exertion (walking fast/running, weedeating, or carrying a heavy load).  No chest pain with milder exertion like walking at a normal pace.  Pain resolves with rest.  Symptoms are similar to last summer when he had a cath showing no flow-limiting coronary stenosis.  He has also noted increased palpitations (known PVCs).   Finally, he has been under a lot of stress at home and at work over the last few months.  Unfortunately, he has restarted smoking 1 ppd.  He says that this was brought on by stress.   Labs (12/10): Creaitnine 1.05, K 4  Labs (2/11): K 4.1, creatinine 1.1, LDL 48, HDL 34, LFTs normal  Labs (8/11): K 3.6, creatinine 1.0, LDL 56, HDL 33, TGs 66   ECG: NSR, normal   Allergies:  1) ! Penicillin   Past Medical History:  1. Prior smoker, quit 12/11  2. History of segmental right lower lobe acute pulmonary emboli with no evidence of right heart strain and overall small  clot burden. This was in 2009. Patient is no longer taking coumadin.  3. Hyperlipidemia.  4. CAD: Unstable angina 12/10. LHC showed 90% distal LAD stenosis and 90% mid RCA stenosis. EF 55%. Patient had Xience DES to the mid RCA. Echo (2/11): EF 55-60%, normal wall motion, no significant valvular abnormalities. Repeat cath (8/11) showed 50-60% proximal LAD stenosis, 40% mid LAD stenosis, 40-50% mid RCA in-stent restenosis, EF 60%.  5. Palpitations: Prior holter monitor unremarkable per patient's report.  6. HTN  7. Depression   Family History:  Family history of premature coronary artery disease   Social History:  Married, lives in [place]. Works as Radiation protection practitioner. Smoked 1 ppd but quit in 12/11. Restarted smoking in 2012.  Occasional ETOH.   ROS: All systems reviewed and negative except as per HPI.   Current Outpatient Prescriptions  Medication Sig Dispense Refill  . aspirin 325 MG tablet Take 325 mg by mouth daily.        . carvedilol (COREG) 6.25 MG tablet Take 6.25 mg by mouth daily.        . citalopram (CELEXA) 20 MG tablet Take 20 mg by mouth daily.        . clopidogrel (PLAVIX) 75 MG tablet Take 75 mg by mouth daily.        . fish oil-omega-3 fatty acids 1000 MG capsule Take 2 g by mouth daily.        Marland Kitchen lisinopril (PRINIVIL,ZESTRIL) 10 MG  tablet Take 10 mg by mouth daily.        . rosuvastatin (CRESTOR) 40 MG tablet Take 0.5 tablets (20 mg total) by mouth daily.  30 tablet  6    BP 114/72  Pulse 54  Ht 5\' 11"  (1.803 m)  Wt 198 lb (89.812 kg)  BMI 27.62 kg/m2 General: NAD Neck: No JVD, no thyromegaly or thyroid nodule.  Lungs: Clear to auscultation bilaterally with normal respiratory effort. CV: Nondisplaced PMI.  Heart regular S1/S2, no S3/S4, no murmur.  No peripheral edema.  No carotid bruit.  Normal pedal pulses.  Abdomen: Soft, nontender, no hepatosplenomegaly, no distention.  Neurologic: Alert and oriented x 3.  Psych: Normal affect. Extremities: No clubbing or  cyanosis.

## 2011-02-22 NOTE — Assessment & Plan Note (Signed)
I counselled him to quit.  He has had intolerable side effect with both Chantix and Wellbutrin in the past.  Chantix would not be ideal for him regardless.  He quit without pharmacological aide last year and will try again.

## 2011-02-22 NOTE — Assessment & Plan Note (Signed)
Patient is again having exertional chest pain, has been stable for a couple of months now.  He had similar symptoms last summer and I cathed him, showing nonobstructive CAD. Symptoms were thought to be due to microvascular angina and improved when he quit smoking. He has started smoking again.  Given his suggestive symptoms, we talked about doing another cath.  However, given the similar episode last summer, I will start with an ETT-myoview.  If he has a significant perfusion defect or if his symptoms worsen, I will cath him.  He needs to quit smoking.  Continue ASA, Plavix, Coreg, lisinopril, Crestor.

## 2011-02-28 ENCOUNTER — Encounter: Payer: Self-pay | Admitting: *Deleted

## 2011-03-06 ENCOUNTER — Ambulatory Visit (HOSPITAL_COMMUNITY): Payer: 59 | Attending: Cardiology | Admitting: Radiology

## 2011-03-06 DIAGNOSIS — R079 Chest pain, unspecified: Secondary | ICD-10-CM | POA: Insufficient documentation

## 2011-03-06 DIAGNOSIS — R002 Palpitations: Secondary | ICD-10-CM

## 2011-03-06 DIAGNOSIS — I2510 Atherosclerotic heart disease of native coronary artery without angina pectoris: Secondary | ICD-10-CM

## 2011-03-06 DIAGNOSIS — R0789 Other chest pain: Secondary | ICD-10-CM

## 2011-03-06 MED ORDER — REGADENOSON 0.4 MG/5ML IV SOLN
0.4000 mg | Freq: Once | INTRAVENOUS | Status: AC
  Administered 2011-03-06: 0.4 mg via INTRAVENOUS

## 2011-03-06 MED ORDER — TECHNETIUM TC 99M TETROFOSMIN IV KIT
33.0000 | PACK | Freq: Once | INTRAVENOUS | Status: AC | PRN
  Administered 2011-03-06: 33 via INTRAVENOUS

## 2011-03-06 MED ORDER — TECHNETIUM TC 99M TETROFOSMIN IV KIT
11.0000 | PACK | Freq: Once | INTRAVENOUS | Status: AC | PRN
  Administered 2011-03-06: 11 via INTRAVENOUS

## 2011-03-06 NOTE — Progress Notes (Signed)
[facility] SITE 3 NUCLEAR MED [address] [place] [address] [phone]  Cardiology Nuclear Med Study  Jesse Page is a 50 y.o. male [id number] 09/01/1960   Nuclear Med Background Indication for Stress Test:  Evaluation for Ischemia and Stent Patency History: 02/11 Echo:EF 55-60%, 08/11 Heart Catheterization: 50-60% mild in-stent restenosis, 08/11 Myocardial Perfusion Study: (-) ischemia EF 51% and 12/10 Stents: RCA Cardiac Risk Factors: Family History - CAD, Hypertension, Lipids and Smoker  Symptoms:  Chest Tightness, DOE, Palpitations and SOB   Nuclear Pre-Procedure Caffeine/Decaff Intake:  None NPO After: 8:00pm   Lungs:  clear IV 0.9% NS with Angio Cath:  20g  IV Site: R Antecubital  IV Started by:  Irean Hong, RN  Chest Size (in):  44 Cup Size: n/a  Height: 5\' 11"  (1.803 m)  Weight:  197 lb (89.359 kg)  BMI:  Body mass index is 27.48 kg/(m^2). Tech Comments:  Held coreg x 24hrs. The patient was unable to get his HR where we needed it to be and was switched to a walking Lexiscan.    Nuclear Med Study 1 or 2 day study: 1 day  Stress Test Type:  Treadmill/Lexiscan  Reading MD: Kristeen Miss, MD  Order Authorizing Provider:  D.McLean  Resting Radionuclide: Technetium 30m Tetrofosmin  Resting Radionuclide Dose: 11.0 mCi   Stress Radionuclide:  Technetium 28m Tetrofosmin  Stress Radionuclide Dose: 33.0 mCi           Stress Protocol Rest HR: 57 Stress HR: 136  Rest BP: 108/72 Stress BP: 191/81  Exercise Time (min): 11:08 METS: 10.60   Predicted Max HR: 169 bpm % Max HR: 80.47 bpm Rate Pressure Product: 27253   Dose of Adenosine (mg):  n/a Dose of Lexiscan: 0.4 mg  Dose of Atropine (mg): n/a Dose of Dobutamine: n/a mcg/kg/min (at max HR)  Stress Test Technologist: Milana Na, EMT-P  Nuclear Technologist:  Domenic Polite, CNMT     Rest Procedure:  Myocardial perfusion imaging was performed at rest 45 minutes following the  intravenous administration of Technetium 79m Tetrofosmin. Rest ECG: Sinus Bradycardia  Stress Procedure:  The patient received IV Lexiscan 0.4 mg over 15-seconds with concurrent low level exercise and then Technetium 35m Tetrofosmin was injected at 30-seconds while the patient continued walking one more minute.  There were non specific changes with Lexiscan and rare pvcs.  He did c/o chest tightness with exercise.  Quantitative spect images were obtained after a 45-minute delay.  Stress ECG: No significant change from baseline ECG  QPS Raw Data Images:  Normal; no motion artifact; normal heart/lung ratio. Stress Images:  Normal homogeneous uptake in all areas of the myocardium. Rest Images:  Normal homogeneous uptake in all areas of the myocardium. Subtraction (SDS):  No evidence of ischemia. Transient Ischemic Dilatation (Normal <1.22):  1.01 Lung/Heart Ratio (Normal <0.45):  0.29  Quantitative Gated Spect Images QGS EDV:  122 ml QGS ESV:  56 ml QGS cine images:  NL LV Function; NL Wall Motion QGS EF: 54%  Impression Exercise Capacity:  Excellent exercise capacity. BP Response:  Normal blood pressure response. Clinical Symptoms:  Mild chest pain/dyspnea. ECG Impression:  No significant ST segment change suggestive of ischemia. Comparison with Prior Nuclear Study: No significant change from previous study  Overall Impression:  Normal stress nuclear study.  No evidence of ischemia.  Normal LV function.    Vesta Mixer, Montez Hageman., MD, [facility]

## 2011-03-07 NOTE — Progress Notes (Addendum)
nuc med report routed to Dr. McLean 03/07/11 Zenovia Justman  Normal study.  Dalton McLean  

## 2011-03-11 ENCOUNTER — Encounter: Payer: Self-pay | Admitting: Cardiology

## 2011-03-12 NOTE — Progress Notes (Signed)
Pt given results  

## 2011-03-15 ENCOUNTER — Ambulatory Visit (INDEPENDENT_AMBULATORY_CARE_PROVIDER_SITE_OTHER): Payer: 59 | Admitting: Cardiology

## 2011-03-15 ENCOUNTER — Encounter: Payer: Self-pay | Admitting: Cardiology

## 2011-03-15 DIAGNOSIS — F17200 Nicotine dependence, unspecified, uncomplicated: Secondary | ICD-10-CM

## 2011-03-15 DIAGNOSIS — I2510 Atherosclerotic heart disease of native coronary artery without angina pectoris: Secondary | ICD-10-CM

## 2011-03-15 DIAGNOSIS — E7800 Pure hypercholesterolemia, unspecified: Secondary | ICD-10-CM

## 2011-03-15 NOTE — Patient Instructions (Signed)
Schedule an appointment with Dr Shirlee Latch in 4 months.

## 2011-03-17 NOTE — Assessment & Plan Note (Signed)
Lexiscan myoview with no ischemia or infarction.  Similar symptoms this summer to last summer, when cath showed no flow-limiting disease.  Symptoms actually decreased now with improved stress control.  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  He needs to increase his exercise level.  He will continue ASA, Coreg, lisinopril, Plavix, and statin.  If symptoms worsen again, would consider ranolazine.

## 2011-03-17 NOTE — Assessment & Plan Note (Signed)
Again patient was strongly counselled to quit smoking.  

## 2011-03-17 NOTE — Assessment & Plan Note (Signed)
LDL very close to goal.  Continue current statin.

## 2011-03-17 NOTE — Progress Notes (Signed)
PCP: Dr. Earlene Plater [facility])  50 yo with h/o smoking and CAD s/p unstable angina with PCI to the RCA presents for followup. Patient was admitted to [facility] in 12/10 with unstable angina-type symptoms. He was getting chest tightness and shortness of breath with only mild exertion (walking) for a couple of weeks. Left heart cath showed significant mid RCA stenosis and apical LAD stenosis. He underwent PCI with Xience DES to the mid RCA.  Last summer, patient developed chest tightness that was both exertional and nonexertional. The pain was relieved by NTG. He was set up for an ETT-myoview that showed probable inferior attenuation but he developed severe chest pain on the treadmill and it was decided to cath him. Left heart cath (04/11/10) showed moderate, non-flow limiting disease in the LAD and mild in-stent restenosis in the RCA. No intervention.   Initially after last summer, he did well.  However, this summer, he has re-developed chest pain.  At last appointment, he reported chest tightness and shortness of breath with heavier exertion (walking fast/running, weedeating, or carrying a heavy load).  No chest pain with milder exertion like walking at a normal pace.  Pain resolved with rest.  Symptoms are similar to last summer when he had a cath showing no flow-limiting coronary stenosis.  He had also noted increased palpitations (known PVCs).     Since last appointment, the chest pain symptoms have seemed to subside somewhat.  His stress level has gone down.  [name] was done in 7/12 and showed EF 54%, no ischemia or infarction.  He is still smoking 1 ppd.    Labs (12/10): Creaitnine 1.05, K 4  Labs (2/11): K 4.1, creatinine 1.1, LDL 48, HDL 34, LFTs normal  Labs (8/11): K 3.6, creatinine 1.0, LDL 56, HDL 33, TGs 66  Labs (7/12): HDL 37, LDL 72, LFTs normal  Allergies:  1) ! Penicillin   Past Medical History:  1. Prior smoker, quit 12/11  2. History of segmental right lower lobe acute  pulmonary emboli with no evidence of right heart strain and overall small clot burden. This was in 2009. Patient is no longer taking coumadin.  3. Hyperlipidemia.  4. CAD: Unstable angina 12/10. LHC showed 90% distal LAD stenosis and 90% mid RCA stenosis. EF 55%. Patient had Xience DES to the mid RCA. Echo (2/11): EF 55-60%, normal wall motion, no significant valvular abnormalities. Repeat cath (8/11) showed 50-60% proximal LAD stenosis, 40% mid LAD stenosis, 40-50% mid RCA in-stent restenosis, EF 60%.  Lexiscan myoview (7/12): EF 54%, no ischemia or infarction.  5. Palpitations: PVCs have been noted.  6. HTN  7. Depression   Family History:  Family history of premature coronary artery disease   Social History:  Married, lives in [place]. Works as Radiation protection practitioner. Smoked 1 ppd but quit in 12/11. Restarted smoking in 2012.  Occasional ETOH.   ROS: All systems reviewed and negative except as per HPI.   Current Outpatient Prescriptions  Medication Sig Dispense Refill  . aspirin 81 MG tablet Take 81 mg by mouth daily.        . carvedilol (COREG) 6.25 MG tablet Take 6.25 mg by mouth 2 (two) times daily with a meal.       . citalopram (CELEXA) 20 MG tablet Take 20 mg by mouth daily.        . clopidogrel (PLAVIX) 75 MG tablet Take 75 mg by mouth daily.        . fish oil-omega-3 fatty acids  1000 MG capsule Take 2 g by mouth daily.        Marland Kitchen lisinopril (PRINIVIL,ZESTRIL) 10 MG tablet Take 10 mg by mouth daily.        . rosuvastatin (CRESTOR) 40 MG tablet Take 0.5 tablets (20 mg total) by mouth daily.  30 tablet  6    BP 102/74  Pulse 56  Resp 16  Ht 5\' 11"  (1.803 m)  Wt 205 lb (92.987 kg)  BMI 28.59 kg/m2 General: NAD Neck: No JVD, no thyromegaly or thyroid nodule.  Lungs: Clear to auscultation bilaterally with normal respiratory effort. CV: Nondisplaced PMI.  Heart regular S1/S2, no S3/S4, no murmur.  No peripheral edema.  No carotid bruit.  Normal pedal pulses.  Abdomen: Soft, nontender, no  hepatosplenomegaly, no distention.  Neurologic: Alert and oriented x 3.  Psych: Normal affect. Extremities: No clubbing or cyanosis.

## 2011-04-26 ENCOUNTER — Other Ambulatory Visit: Payer: Self-pay | Admitting: Adult Health

## 2011-05-03 ENCOUNTER — Other Ambulatory Visit: Payer: Self-pay | Admitting: Adult Health

## 2011-05-06 ENCOUNTER — Other Ambulatory Visit: Payer: Self-pay | Admitting: *Deleted

## 2011-05-06 MED ORDER — CLOPIDOGREL BISULFATE 75 MG PO TABS: 75.0000 mg | ORAL_TABLET | Freq: Every day | ORAL | Status: DC

## 2011-05-27 ENCOUNTER — Other Ambulatory Visit: Payer: Self-pay

## 2011-07-19 ENCOUNTER — Telehealth: Payer: Self-pay

## 2011-07-19 NOTE — Telephone Encounter (Signed)
Error

## 2011-07-22 ENCOUNTER — Other Ambulatory Visit: Payer: Self-pay

## 2011-07-22 MED ORDER — ROSUVASTATIN CALCIUM 40 MG PO TABS: 20.0000 mg | ORAL_TABLET | Freq: Every day | ORAL | Status: DC

## 2011-07-31 ENCOUNTER — Ambulatory Visit (INDEPENDENT_AMBULATORY_CARE_PROVIDER_SITE_OTHER): Payer: 59 | Admitting: Cardiology

## 2011-07-31 ENCOUNTER — Encounter: Payer: Self-pay | Admitting: Cardiology

## 2011-07-31 VITALS — BP 120/80 | HR 63 | Ht 71.0 in | Wt 200.0 lb

## 2011-07-31 DIAGNOSIS — F17200 Nicotine dependence, unspecified, uncomplicated: Secondary | ICD-10-CM

## 2011-07-31 DIAGNOSIS — I2510 Atherosclerotic heart disease of native coronary artery without angina pectoris: Secondary | ICD-10-CM

## 2011-07-31 DIAGNOSIS — E7800 Pure hypercholesterolemia, unspecified: Secondary | ICD-10-CM

## 2011-07-31 LAB — BASIC METABOLIC PANEL
BUN: 14 mg/dL (ref 6–23)
Chloride: 107 mEq/L (ref 96–112)
Glucose, Bld: 108 mg/dL — ABNORMAL HIGH (ref 70–99)
Potassium: 4.3 mEq/L (ref 3.5–5.1)

## 2011-07-31 LAB — LIPID PANEL: Cholesterol: 158 mg/dL (ref 0–200)

## 2011-07-31 NOTE — Assessment & Plan Note (Addendum)
Lexiscan myoview with no ischemia or infarction in 7/12.  Similar symptoms this summer to last summer, when cath showed no flow-limiting disease.  Symptoms much better now, no further chest pain.  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  He needs to increase his exercise level.  He will continue ASA (can decrease to 81 mg daily), Coreg, lisinopril, Plavix, and statin.  If symptoms worsen again, would consider ranolazine.

## 2011-07-31 NOTE — Assessment & Plan Note (Signed)
Again patient was strongly counselled to quit smoking.

## 2011-07-31 NOTE — Assessment & Plan Note (Signed)
Will check lipids/LFTs today, goal LDL < 70.

## 2011-07-31 NOTE — Patient Instructions (Signed)
Your physician recommends that you have  lab work today--lipid profile/BMET 414.01  Your physician wants you to follow-up in: 6 months with Dr Shirlee Latch. (June 2013). You will receive a reminder letter in the mail two months in advance. If you don't receive a letter, please call our office to schedule the follow-up appointment.

## 2011-07-31 NOTE — Progress Notes (Signed)
PCP: Dr. Earlene Plater [facility])  50 yo with h/o smoking and CAD s/p unstable angina with PCI to the RCA presents for followup. Patient was admitted to [facility] in 12/10 with unstable angina-type symptoms. He was getting chest tightness and shortness of breath with only mild exertion (walking) for a couple of weeks. Left heart cath showed significant mid RCA stenosis and apical LAD stenosis. He underwent PCI with Xience DES to the mid RCA.  In the summer of 2011, patient developed chest tightness that was both exertional and nonexertional. The pain was relieved by NTG. He was set up for an ETT-myoview that showed probable inferior attenuation but he developed severe chest pain on the treadmill and it was decided to cath him. Left heart cath (04/11/10) showed moderate, non-flow limiting disease in the LAD and mild in-stent restenosis in the RCA. No intervention.   Patient again developed chest pain episodes in the summer of 2012.  [name] was done in 7/12 and showed EF 54%, no ischemia or infarction.    He is still smoking 1 ppd.  No further chest pain.  No exertional dyspnea.  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 [employer] [facility] a couple of months ago and is looking for another job.  He is not getting regular exercise.   Labs (12/10): Creaitnine 1.05, K 4  Labs (2/11): K 4.1, creatinine 1.1, LDL 48, HDL 34, LFTs normal  Labs (8/11): K 3.6, creatinine 1.0, LDL 56, HDL 33, TGs 66  Labs (7/12): HDL 37, LDL 72, LFTs normal  Allergies:  1) ! Penicillin   Past Medical History:  1. Prior smoker, quit 12/11  2. History of segmental right lower lobe acute pulmonary emboli with no evidence of right heart strain and overall small clot burden. This was in 2009. Patient is no longer taking coumadin.  3. Hyperlipidemia.  4. CAD: Unstable angina 12/10. LHC showed 90% distal LAD stenosis and 90% mid RCA stenosis. EF 55%. Patient had Xience DES to the mid RCA. Echo (2/11): EF 55-60%, normal wall motion, no  significant valvular abnormalities. Repeat cath (8/11) showed 50-60% proximal LAD stenosis, 40% mid LAD stenosis, 40-50% mid RCA in-stent restenosis, EF 60%.  Lexiscan myoview (7/12): EF 54%, no ischemia or infarction.  5. Palpitations: PVCs have been noted.  6. HTN  7. Depression   Family History:  Family history of premature coronary artery disease   Social History:  Married, lives in [place]. 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 Restarted smoking in 2012.  Occasional ETOH.    Current Outpatient Prescriptions  Medication Sig Dispense Refill  . carvedilol (COREG) 6.25 MG tablet TAKE 1 TABLET BY MOUTH TWICE DAILY WITH MEALS  60 tablet  PRN  . citalopram (CELEXA) 20 MG tablet Take 20 mg by mouth daily.        . clopidogrel (PLAVIX) 75 MG tablet Take 1 tablet (75 mg total) by mouth daily.  90 tablet  3  . fish oil-omega-3 fatty acids 1000 MG capsule Take 2 g by mouth daily.        Marland Kitchen lisinopril (PRINIVIL,ZESTRIL) 10 MG tablet TAKE 1 TABLET BY MOUTH DAILY  30 tablet  6  . rosuvastatin (CRESTOR) 40 MG tablet Take 0.5 tablets (20 mg total) by mouth daily.  30 tablet  6  . DISCONTD: aspirin 325 MG tablet Take 325 mg by mouth daily.        Marland Kitchen aspirin EC 81 MG tablet Take 1 tablet (81  mg total) by mouth daily.      Marland Kitchen DISCONTD: aspirin 81 MG tablet Take 81 mg by mouth daily.          BP 120/80  Pulse 63  Ht 5\' 11"  (1.803 m)  Wt 90.719 kg (200 lb)  BMI 27.89 kg/m2 General: NAD Neck: No JVD, no thyromegaly or thyroid nodule.  Lungs: Clear to auscultation bilaterally with normal respiratory effort. CV: Nondisplaced PMI.  Heart regular S1/S2, no S3/S4, no murmur.  No peripheral edema.  No carotid bruit.  Normal pedal pulses.  Abdomen: Soft, nontender, no hepatosplenomegaly, no distention.  Neurologic: Alert and oriented x 3.  Psych: Normal affect. Extremities: No clubbing or cyanosis.

## 2011-08-01 ENCOUNTER — Telehealth: Payer: Self-pay | Admitting: *Deleted

## 2011-08-01 MED ORDER — ROSUVASTATIN CALCIUM 40 MG PO TABS: 40.0000 mg | ORAL_TABLET | Freq: Every day | ORAL | Status: DC

## 2011-08-01 NOTE — Telephone Encounter (Signed)
Notes Recorded by Jacqlyn Krauss, RN on 08/01/2011 at 12:46 PM Discussed with pt. I received authorization for Crestor 40mg  daily [phone] for 1 year until 07/31/12. Notes Recorded by [name], MD on 08/01/2011 at 12:19 PM Would increase Crestor to 40 for goal LDL < 70 .

## 2011-10-08 ENCOUNTER — Other Ambulatory Visit: Payer: Self-pay | Admitting: *Deleted

## 2011-10-08 ENCOUNTER — Telehealth: Payer: Self-pay | Admitting: Cardiology

## 2011-10-08 MED ORDER — NITROGLYCERIN 0.4 MG SL SUBL: 0.4000 mg | SUBLINGUAL_TABLET | SUBLINGUAL | Status: DC | PRN

## 2011-10-08 NOTE — Telephone Encounter (Signed)
New Msg: pt needs refill of nitro 0.4 mg called [facility].

## 2011-10-15 NOTE — Telephone Encounter (Signed)
Pharmacy confirmed pt has current nitrostat prescription

## 2011-10-25 ENCOUNTER — Other Ambulatory Visit: Payer: Self-pay

## 2011-10-25 MED ORDER — CLOPIDOGREL BISULFATE 75 MG PO TABS: 75.0000 mg | ORAL_TABLET | Freq: Every day | ORAL | Status: DC

## 2011-12-11 ENCOUNTER — Other Ambulatory Visit: Payer: Self-pay | Admitting: Cardiology

## 2011-12-11 MED ORDER — LISINOPRIL 10 MG PO TABS: 10.0000 mg | ORAL_TABLET | Freq: Every day | ORAL | Status: DC

## 2012-01-02 DIAGNOSIS — F32A Depression, unspecified: Secondary | ICD-10-CM | POA: Insufficient documentation

## 2012-01-02 DIAGNOSIS — I10 Essential (primary) hypertension: Secondary | ICD-10-CM | POA: Insufficient documentation

## 2012-01-02 DIAGNOSIS — F329 Major depressive disorder, single episode, unspecified: Secondary | ICD-10-CM | POA: Insufficient documentation

## 2012-01-02 DIAGNOSIS — I259 Chronic ischemic heart disease, unspecified: Secondary | ICD-10-CM | POA: Insufficient documentation

## 2012-01-02 DIAGNOSIS — E785 Hyperlipidemia, unspecified: Secondary | ICD-10-CM | POA: Insufficient documentation

## 2012-01-02 DIAGNOSIS — M461 Sacroiliitis, not elsewhere classified: Secondary | ICD-10-CM | POA: Insufficient documentation

## 2012-01-14 ENCOUNTER — Telehealth: Payer: Self-pay | Admitting: Cardiology

## 2012-01-14 NOTE — Telephone Encounter (Signed)
[facility] return [facility]- tt Ritta Slot pharmacist  Patient RX reads rosuvastatin (CRESTOR) 40 MG tablet Patient says he is suppose to have 20 mg tablets instead of 40mg , pharmacist also need 90 day supply script.  Please return call to Pharmacist Ritta Slot [phone].

## 2012-01-14 NOTE — Telephone Encounter (Signed)
Crestor was increased to 40mg  daily in December 2012. LMTCB for pt to verify dose of Crestor.

## 2012-01-15 NOTE — Telephone Encounter (Signed)
LMTCB

## 2012-01-20 NOTE — Telephone Encounter (Signed)
Spoke with pt and verified Crestor should be 40mg  daily. Also spoke with Ritta Slot at [facility] and gave her prescription for Crestor 40mg  daily #90 1 refill.

## 2012-02-25 ENCOUNTER — Encounter: Payer: Self-pay | Admitting: Cardiology

## 2012-02-25 ENCOUNTER — Ambulatory Visit (INDEPENDENT_AMBULATORY_CARE_PROVIDER_SITE_OTHER): Payer: BC Managed Care – PPO | Admitting: Cardiology

## 2012-02-25 VITALS — BP 145/98 | HR 54 | Ht 71.0 in | Wt 195.1 lb

## 2012-02-25 DIAGNOSIS — R0602 Shortness of breath: Secondary | ICD-10-CM

## 2012-02-25 DIAGNOSIS — I2510 Atherosclerotic heart disease of native coronary artery without angina pectoris: Secondary | ICD-10-CM

## 2012-02-25 NOTE — Progress Notes (Signed)
PCP: Dr. Earlene Plater [facility])  51 yo with h/o smoking, HLD and CAD s/p unstable angina with PCI to the RCA presents for followup. Patient was admitted to [facility] in 12/10 with unstable angina-type symptoms. He was getting chest tightness and shortness of breath with only mild exertion (walking) for a couple of weeks. Left heart cath showed significant mid RCA stenosis and apical LAD stenosis. He underwent PCI with Xience DES to the mid RCA.  In the summer of 2011, patient developed chest tightness that was both exertional and nonexertional. The pain was relieved by NTG. He was set up for an ETT-myoview that showed probable inferior attenuation but he developed severe chest pain on the treadmill and it was decided to cath him. Left heart cath (04/11/10) showed moderate, non-flow limiting disease in the LAD and mild in-stent restenosis in the RCA. No intervention.   Patient again developed chest pain episodes in the summer of 2012.  [name] was done in 7/12 and showed EF 54%, no ischemia or infarction.    He is still smoking 1 ppd. He states that he is not sure whether he can stop smoking or not.  He reports dyspnea on exertion including walking a flight of stairs or cutting grass.    No orthopnea or PND. No chest pain or chest pressure.  He thinks that his dyspnea is due to his long term smoking.  He only does light housework like cutting grass, washing the car and light cleaning at his home. He does not exercise, states that he is "lazy" and does not want to exercise. He eats low sodium diet and watch his calories intake and reports minimum weight changes over one year.    Labs (12/10): Creaitnine 1.05, K 4  Labs (2/11): K 4.1, creatinine 1.1, LDL 48, HDL 34, LFTs normal  Labs (8/11): K 3.6, creatinine 1.0, LDL 56, HDL 33, TGs 66  Labs (7/12): HDL 37, LDL 72, LFTs normal Labs (62/13): K 4.3, Creatinine 1.0,   Date: 02/25/2012  EKG Time: 10:45 AM  Rate: 60 bpm  Rhythm: normal sinus  rhythm Axis: Normal  Intervals:none  ST&T Change: none  Narrative Interpretation: NSR   Allergies:  1) ! Penicillin   Past Medical History:  1. Prior smoker, quit 12/11  2. History of segmental right lower lobe acute pulmonary emboli with no evidence of right heart strain and overall small clot burden. This was in 2009. Patient is no longer taking coumadin.  3. Hyperlipidemia.  4. CAD: Unstable angina 12/10. LHC showed 90% distal LAD stenosis and 90% mid RCA stenosis. EF 55%. Patient had Xience DES to the mid RCA. Echo (2/11): EF 55-60%, normal wall motion, no significant valvular abnormalities. Repeat cath (8/11) showed 50-60% proximal LAD stenosis, 40% mid LAD stenosis, 40-50% mid RCA in-stent restenosis, EF 60%.  Lexiscan myoview (7/12): EF 54%, no ischemia or infarction.  5. Palpitations: PVCs have been noted.  6. HTN  7. Depression   Family History:  Family history of premature coronary artery disease   Social History:  Married, lives in [place]. 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 Restarted smoking in 2012.  Occasional ETOH.    Current Outpatient Prescriptions  Medication Sig Dispense Refill  . aspirin EC 81 MG tablet Take 1 tablet (81 mg total) by mouth daily.      . carvedilol (COREG) 6.25 MG tablet TAKE 1 TABLET BY MOUTH TWICE DAILY WITH MEALS  60 tablet  PRN  .  citalopram (CELEXA) 20 MG tablet Take 20 mg by mouth daily.        . clopidogrel (PLAVIX) 75 MG tablet Take 1 tablet (75 mg total) by mouth daily.  90 tablet  3  . lisinopril (PRINIVIL,ZESTRIL) 10 MG tablet Take 1 tablet (10 mg total) by mouth daily.  90 tablet  3  . nitroGLYCERIN (NITROSTAT) 0.4 MG SL tablet Place 1 tablet (0.4 mg total) under the tongue every 5 (five) minutes as needed for chest pain.  25 tablet  3  . rosuvastatin (CRESTOR) 40 MG tablet Take 1 tablet (40 mg total) by mouth daily.  30 tablet  11    BP 145/98  Pulse 54  Ht 5\' 11"  (1.803 m)  Wt 195 lb 1.9 oz (88.506 kg)   BMI 27.21 kg/m2 General: NAD Neck: No JVD, no thyromegaly or thyroid nodule.  Lungs: Clear to auscultation bilaterally with normal respiratory effort. CV: Nondisplaced PMI.  Heart regular S1/S2, no S3/S4, no murmur.  No peripheral edema.  No carotid bruit.  Normal pedal pulses.  Abdomen: Soft, nontender, no hepatosplenomegaly, no distention.  Neurologic: Alert and oriented x 3.  Psych: Normal affect. Extremities: No clubbing or cyanosis.   Assessment and plan  Dyspnea on exertion Patient is a long term smoker and unable to quit the smoking. He reports dyspnea on exertion roughly since last October. He thinks that his symptoms are related to his smoking and sedatary life style. No chest pain or chest pressure. Last EF 54% on Myoview in July 2012. No weight gain. Good volume status during office visit. No heart Murmur noted.  - the etiology of his Dyspnea on exertion is unclear. Unlikely cardiac source since he has no chest pain or chest pressure, and he is euvolemic with EF 54% last July. His long term smoking may contribute to his symptoms. He will need to be evaluated by his PCP.   -  Will perform PFTS and send a referral for Pulmonologist.  CORONARY ATHEROSCLEROSIS NATIVE CORONARY ARTERY -  Lexiscan myoview with no ischemia or infarction in 7/12. Similar symptoms this summer to last summer, when cath showed no flow-limiting disease. Symptoms much better now, no further chest pain. 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 He needs to increase his exercise level.   -continue ASA Coreg, lisinopril, Plavix, and statin. If symptoms worsen again, would consider ranolazine.  - check bmp, LFTs - follow up in 6 months  Hypertension He does not check his BP at home and is not sure how they are running.  His BP is 145/98 at the clinic today and repeat BP is 138/80.   - Encourage patient to get the BP monitor to check it once daily - will not change his  medication.  HYPERCHOLESTEROLEMIA - -goal LDL < 70.  - last Lipid panel on 07/30/12 showed LDL 89 - continue Crestor -Check Lipid panel  Smoking  - he states that he was unable to tolerate the psychiatric side effects from Wellbutrin and Chantix in the past. And he was not compliant with nicotine patch either. - Again patient was strongly counselled to quit smoking. - the risk of Tobacco abuse is reinforced with patient.  Patient seen with resident Dr. Dierdre Searles.  I agree with the above note.  Will continue current meds, check lipids, and get PFTs/pulmonology referral.   [name] 02/25/2012

## 2012-02-25 NOTE — Patient Instructions (Addendum)
Your physician recommends that you have a FASTING lipid profile/liver profile/BMET today  Your physician has recommended that you have a pulmonary function test. Pulmonary Function Tests are a group of tests that measure how well air moves in and out of your lungs.  You have been referred to [facility] for evaluation of your shortness of breath. Have the PFT done before you see the pulmonary doctor.   Your physician wants you to follow-up in: 6 months with Dr Shirlee Latch. (January 2014). You will receive a reminder letter in the mail two months in advance. If you don't receive a letter, please call our office to schedule the follow-up appointment.

## 2012-02-26 LAB — BASIC METABOLIC PANEL
BUN: 11 mg/dL (ref 6–23)
Calcium: 9.4 mg/dL (ref 8.4–10.5)
Creatinine, Ser: 1 mg/dL (ref 0.4–1.5)
GFR: 79.57 mL/min (ref >60.00)
Glucose, Bld: 99 mg/dL (ref 70–99)
Potassium: 4.6 mEq/L (ref 3.5–5.1)

## 2012-02-26 LAB — LIPID PANEL
Cholesterol: 145 mg/dL (ref 0–200)
HDL: 44.9 mg/dL (ref >39.00)
LDL Cholesterol: 73 mg/dL (ref 0–99)
Triglycerides: 134 mg/dL (ref 0.0–149.0)
VLDL: 26.8 mg/dL (ref 0.0–40.0)

## 2012-02-26 LAB — HEPATIC FUNCTION PANEL
Albumin: 4.6 g/dL (ref 3.5–5.2)
Total Bilirubin: 0.6 mg/dL (ref 0.3–1.2)

## 2012-04-01 ENCOUNTER — Encounter: Payer: Self-pay | Admitting: Pulmonary Disease

## 2012-04-01 ENCOUNTER — Ambulatory Visit (INDEPENDENT_AMBULATORY_CARE_PROVIDER_SITE_OTHER)
Admission: RE | Admit: 2012-04-01 | Discharge: 2012-04-01 | Disposition: A | Discharge status: Home / Self Care | Payer: BC Managed Care – PPO | Source: Ambulatory Visit | Attending: Pulmonary Disease | Admitting: Pulmonary Disease

## 2012-04-01 ENCOUNTER — Ambulatory Visit (INDEPENDENT_AMBULATORY_CARE_PROVIDER_SITE_OTHER): Payer: BC Managed Care – PPO | Admitting: Pulmonary Disease

## 2012-04-01 VITALS — BP 116/80 | HR 61 | Temp 97.7°F | Ht 71.0 in | Wt 196.0 lb

## 2012-04-01 DIAGNOSIS — R0602 Shortness of breath: Secondary | ICD-10-CM

## 2012-04-01 DIAGNOSIS — R0989 Other specified symptoms and signs involving the circulatory and respiratory systems: Secondary | ICD-10-CM

## 2012-04-01 DIAGNOSIS — R0600 Dyspnea, unspecified: Secondary | ICD-10-CM

## 2012-04-01 LAB — PULMONARY FUNCTION TEST

## 2012-04-01 NOTE — Progress Notes (Signed)
PFT done today. 

## 2012-04-01 NOTE — Progress Notes (Signed)
  Subjective:    Patient ID: Jesse Page, male    DOB: 09/13/1961, 51 y.o.   MRN: [id number]  HPI The patient is a 51 year old male who I've been asked to see for dyspnea.  The patient has a long history of smoking, and continues to do so.  He is complaining of dyspnea on exertion for years, but feels it has gotten worse over the last one year.  He describes a 2 block dyspnea on exertion at a moderate pace on flat ground, but at a slow pace he can walk for miles.  He does not get winded bringing groceries in from the car, or walking up one flight of stairs.  He does have a cough, especially in the morning that is productive of scan white mucus.  He also describes intermittent upper airway wheezing.  He has had no significant lower extremity edema, and his weight is been stable over the last 1-2 years.  He has not had a recent chest x-ray, but did have a stress test last summer which was normal.  He has a history of a pulmonary embolus in 2009 which was uncomplicated, and did well with a short course of Coumadin.  He has had pulmonary function studies today that showed no airflow obstruction, no restrictions, and a normal diffusion capacity.   Review of Systems  Constitutional: Negative for fever and unexpected weight change.  HENT: Negative for ear pain, nosebleeds, congestion, sore throat, rhinorrhea, sneezing, trouble swallowing, dental problem, postnasal drip and sinus pressure.   Eyes: Negative for redness and itching.  Respiratory: Positive for cough and shortness of breath. Negative for chest tightness and wheezing.   Cardiovascular: Positive for palpitations. Negative for leg swelling.  Gastrointestinal: Negative for nausea and vomiting.  Genitourinary: Negative for dysuria.  Musculoskeletal: Negative for joint swelling.  Skin: Negative for rash.  Neurological: Negative for headaches.  Hematological: Does not bruise/bleed easily.  Psychiatric/Behavioral: Negative for dysphoric mood. The  patient is nervous/anxious.   All other systems reviewed and are negative.       Objective:   Physical Exam Constitutional:  Well developed, no acute distress  HENT:  Nares patent without discharge, clear mucus in nasal airway  Oropharynx without exudate, palate and uvula are elongated.   Eyes:  Perrla, eomi, no scleral icterus  Neck:  No JVD, no TMG  Cardiovascular:  Normal rate, regular rhythm, no rubs or gallops.  No murmurs        Intact distal pulses  Pulmonary :  Normal breath sounds, no stridor or respiratory distress   No rales, rhonchi, or wheezing  Abdominal:  Soft, nondistended, bowel sounds present.  No tenderness noted.   Musculoskeletal:  No lower extremity edema noted.  Lymph Nodes:  No cervical lymphadenopathy noted  Skin:  No cyanosis noted  Neurologic:  Alert, appropriate, moves all 4 extremities without obvious deficit.         Assessment & Plan:

## 2012-04-01 NOTE — Progress Notes (Signed)
Quick Note:  ATC patient, no answer. LMTCB ______ 

## 2012-04-01 NOTE — Assessment & Plan Note (Signed)
The patient has dyspnea with primarily more moderate to heavy exertional activities, and his pulmonary function studies today are totally normal.  However, given his ongoing smoking, I suspect he has day-to-day variability in airway inflammation and airway tone that can lead to some days being worse than others.  I have stressed to him the importance of total smoking cessation, as well as a conditioning program.  I really do not see enough obstructive airways disease here to start on a maintenance inhaler.  I will give him a rescue inhaler in the event of emergencies.  If he continues to have shortness of breath despite quitting smoking and working on an exercise program, could consider an echocardiogram to evaluate for pulmonary hypertension with his history of thromboembolic disease.

## 2012-04-01 NOTE — Patient Instructions (Addendum)
Your breathing tests today are normal today Work on smoking cessation and some type conditioning program. Will give you an albuterol inhaler to use 2 puffs every 6hrs if needed for rescue/emergencies only.  Will check a cxr today, and call you with results. followup with me as needed.

## 2012-04-02 ENCOUNTER — Other Ambulatory Visit: Payer: Self-pay | Admitting: *Deleted

## 2012-04-02 MED ORDER — CARVEDILOL 6.25 MG PO TABS
6.2500 mg | ORAL_TABLET | Freq: Two times a day (BID) | ORAL | Status: DC
Start: 2012-04-02 — End: 2013-03-15

## 2012-04-03 ENCOUNTER — Telehealth: Payer: Self-pay | Admitting: Pulmonary Disease

## 2012-04-03 NOTE — Telephone Encounter (Signed)
Spoke with patient-aware of CXR results.  

## 2012-04-03 NOTE — Progress Notes (Signed)
Quick Note:  Pt aware of results. ______ 

## 2012-11-26 ENCOUNTER — Other Ambulatory Visit: Payer: Self-pay | Admitting: Cardiology

## 2012-12-23 DIAGNOSIS — Z9861 Coronary angioplasty status: Secondary | ICD-10-CM | POA: Insufficient documentation

## 2013-02-19 ENCOUNTER — Other Ambulatory Visit: Payer: Self-pay | Admitting: Cardiology

## 2013-03-15 ENCOUNTER — Other Ambulatory Visit: Payer: Self-pay | Admitting: Cardiology

## 2013-03-17 ENCOUNTER — Other Ambulatory Visit: Payer: Self-pay | Admitting: *Deleted

## 2013-03-17 MED ORDER — CARVEDILOL 6.25 MG PO TABS: ORAL_TABLET | ORAL | Status: DC

## 2013-06-07 ENCOUNTER — Other Ambulatory Visit: Payer: Self-pay | Admitting: Cardiology

## 2013-07-26 ENCOUNTER — Other Ambulatory Visit: Payer: Self-pay | Admitting: Cardiology

## 2013-10-29 ENCOUNTER — Other Ambulatory Visit: Payer: Self-pay | Admitting: *Deleted

## 2013-10-29 MED ORDER — LISINOPRIL 10 MG PO TABS: ORAL_TABLET | ORAL | Status: DC

## 2013-10-29 MED ORDER — CLOPIDOGREL BISULFATE 75 MG PO TABS: ORAL_TABLET | ORAL | Status: DC

## 2013-10-29 MED ORDER — ROSUVASTATIN CALCIUM 40 MG PO TABS: ORAL_TABLET | ORAL | Status: DC

## 2013-10-29 MED ORDER — CARVEDILOL 6.25 MG PO TABS: ORAL_TABLET | ORAL | Status: DC

## 2013-11-10 ENCOUNTER — Ambulatory Visit: Payer: BC Managed Care – PPO | Admitting: Physician Assistant

## 2013-11-24 ENCOUNTER — Encounter: Payer: Self-pay | Admitting: Physician Assistant

## 2013-11-29 ENCOUNTER — Other Ambulatory Visit: Payer: Self-pay | Admitting: Cardiology

## 2013-12-02 ENCOUNTER — Other Ambulatory Visit: Payer: Self-pay | Admitting: Cardiology

## 2013-12-04 ENCOUNTER — Other Ambulatory Visit: Payer: Self-pay | Admitting: Cardiology

## 2014-01-11 ENCOUNTER — Other Ambulatory Visit: Payer: Self-pay | Admitting: Cardiology

## 2014-01-12 ENCOUNTER — Telehealth: Payer: Self-pay | Admitting: *Deleted

## 2014-01-12 NOTE — Telephone Encounter (Signed)
Jacqlyn Krauss, RN at 01/12/2014 9:20 AM     Status: Signed        He has not seen Dr Shirlee Latch since July 2013. Once he has an appt with Dr Shirlee Latch you could refill his medication. Erin Sons is Dr Alford Highland scheduler and could call him to make an appt for him with Dr Shirlee Latch. Thanks.     Can you send this back to me when you have scheduled him an appt? Thanks, MI

## 2014-01-12 NOTE — Telephone Encounter (Signed)
He has not seen Dr Shirlee Latch since July 2013. Once he has an appt with Dr Shirlee Latch you could refill his medication. Erin Sons is Dr Alford Highland scheduler and could call him to make an appt for him with Dr Shirlee Latch. Thanks.

## 2014-01-12 NOTE — Telephone Encounter (Signed)
Can this be refilled again? He has been made aware that he needs appointment and had one scheduled for 11/10/13, but he no-showed. Please advise. Thanks, MI

## 2014-01-12 NOTE — Telephone Encounter (Signed)
Pt has appt sched 04/21/14 @ 2:15

## 2014-01-12 NOTE — Telephone Encounter (Signed)
Pt has appt sched 04/21/14 @ 2:15 °

## 2014-01-14 ENCOUNTER — Other Ambulatory Visit: Payer: Self-pay | Admitting: Cardiology

## 2014-04-21 ENCOUNTER — Encounter: Payer: Self-pay | Admitting: *Deleted

## 2014-04-21 ENCOUNTER — Encounter: Payer: Self-pay | Admitting: Cardiology

## 2014-04-21 ENCOUNTER — Ambulatory Visit (INDEPENDENT_AMBULATORY_CARE_PROVIDER_SITE_OTHER): Payer: BC Managed Care – PPO | Admitting: Cardiology

## 2014-04-21 VITALS — BP 142/88 | HR 78 | Ht 71.0 in | Wt 203.0 lb

## 2014-04-21 DIAGNOSIS — I2510 Atherosclerotic heart disease of native coronary artery without angina pectoris: Secondary | ICD-10-CM

## 2014-04-21 DIAGNOSIS — R079 Chest pain, unspecified: Secondary | ICD-10-CM

## 2014-04-21 DIAGNOSIS — E7800 Pure hypercholesterolemia, unspecified: Secondary | ICD-10-CM

## 2014-04-21 DIAGNOSIS — R0609 Other forms of dyspnea: Secondary | ICD-10-CM

## 2014-04-21 DIAGNOSIS — R0989 Other specified symptoms and signs involving the circulatory and respiratory systems: Secondary | ICD-10-CM

## 2014-04-21 DIAGNOSIS — R0683 Snoring: Secondary | ICD-10-CM

## 2014-04-21 DIAGNOSIS — I25119 Atherosclerotic heart disease of native coronary artery with unspecified angina pectoris: Secondary | ICD-10-CM

## 2014-04-21 DIAGNOSIS — F17200 Nicotine dependence, unspecified, uncomplicated: Secondary | ICD-10-CM

## 2014-04-21 DIAGNOSIS — I209 Angina pectoris, unspecified: Secondary | ICD-10-CM

## 2014-04-21 LAB — CBC WITH DIFFERENTIAL/PLATELET
Basophils Absolute: 0 10*3/uL (ref 0.0–0.1)
Basophils Relative: 0.4 % (ref 0.0–3.0)
EOS PCT: 2.1 % (ref 0.0–5.0)
Eosinophils Absolute: 0.2 10*3/uL (ref 0.0–0.7)
HCT: 50.6 % (ref 39.0–52.0)
HEMOGLOBIN: 16.9 g/dL (ref 13.0–17.0)
Lymphocytes Relative: 23.3 % (ref 12.0–46.0)
Lymphs Abs: 2.6 10*3/uL (ref 0.7–4.0)
MCHC: 33.3 g/dL (ref 30.0–36.0)
MCV: 96.2 fl (ref 78.0–100.0)
MONO ABS: 1.1 10*3/uL — AB (ref 0.1–1.0)
Monocytes Relative: 9.7 % (ref 3.0–12.0)
NEUTROS PCT: 64.5 % (ref 43.0–77.0)
Neutro Abs: 7.1 10*3/uL (ref 1.4–7.7)
Platelets: 262 10*3/uL (ref 150.0–400.0)
RBC: 5.26 Mil/uL (ref 4.22–5.81)
RDW: 14.4 % (ref 11.5–15.5)
WBC: 11 10*3/uL — AB (ref 4.0–10.5)

## 2014-04-21 LAB — BASIC METABOLIC PANEL
BUN: 11 mg/dL (ref 6–23)
CHLORIDE: 105 meq/L (ref 96–112)
CO2: 25 mEq/L (ref 19–32)
CREATININE: 1 mg/dL (ref 0.4–1.5)
Calcium: 9.1 mg/dL (ref 8.4–10.5)
GFR: 87.61 mL/min (ref >60.00)
Glucose, Bld: 79 mg/dL (ref 70–99)
Potassium: 4 mEq/L (ref 3.5–5.1)
SODIUM: 137 meq/L (ref 135–145)

## 2014-04-21 LAB — PROTIME-INR
INR: 1 ratio (ref 0.8–1.0)
PROTHROMBIN TIME: 10.6 s (ref 9.6–13.1)

## 2014-04-21 MED ORDER — CARVEDILOL 6.25 MG PO TABS: 6.2500 mg | ORAL_TABLET | Freq: Two times a day (BID) | ORAL | Status: DC

## 2014-04-21 MED ORDER — ATORVASTATIN CALCIUM 80 MG PO TABS: 80.0000 mg | ORAL_TABLET | Freq: Every day | ORAL | Status: DC

## 2014-04-21 MED ORDER — CLOPIDOGREL BISULFATE 75 MG PO TABS: 75.0000 mg | ORAL_TABLET | Freq: Every day | ORAL | Status: DC

## 2014-04-21 MED ORDER — NITROGLYCERIN 0.4 MG SL SUBL: SUBLINGUAL_TABLET | SUBLINGUAL | Status: DC

## 2014-04-21 MED ORDER — LISINOPRIL 10 MG PO TABS: 10.0000 mg | ORAL_TABLET | Freq: Every day | ORAL | Status: DC

## 2014-04-21 NOTE — Progress Notes (Signed)
Patient ID: Jesse Page, male   DOB: November 23, 1960, 53 y.o.   MRN: [id number] PCP: Dr. Earlene Plater [facility])  53 yo with h/o smoking and CAD s/p unstable angina with PCI to the RCA presents for followup. Patient was admitted to [facility] in 12/10 with unstable angina-type symptoms. He was getting chest tightness and shortness of breath with only mild exertion (walking) for a couple of weeks. Left heart cath showed significant mid RCA stenosis and apical LAD stenosis. He underwent PCI with Xience DES to the mid RCA.  In the summer of 2011, patient developed chest tightness that was both exertional and nonexertional. The pain was relieved by NTG. He was set up for an ETT-myoview that showed probable inferior attenuation but he developed severe chest pain on the treadmill and it was decided to cath him. Left heart cath (04/11/10) showed moderate, non-flow limiting disease in the LAD and mild in-stent restenosis in the RCA. No intervention.   Patient again developed chest pain episodes in the summer of 2012.  [name] was done in 7/12 and showed EF 54%, no ischemia or infarction.    I have not seen him for about 3 years.  He is still smoking 1 ppd.  He is working for the [employer] in [place] now.  He has been out of all his medications for about 2 months.  Also for several months, he has been having exertional chest pain.   He has chest pain on most days.  He will note it walking up stairs or an incline.  It resolves with rest.  Lately, he has been having some episodes at rest.  He is out of NTG. He is very fatigued in general with exertion over the last several months.  He is worn out and sleepy during the day.  His wife says that he snores loudly.    ECG: NSR, normal  Labs (12/10): Creaitnine 1.05, K 4  Labs (2/11): K 4.1, creatinine 1.1, LDL 48, HDL 34, LFTs normal  Labs (8/11): K 3.6, creatinine 1.0, LDL 56, HDL 33, TGs 66  Labs (7/12): HDL 37, LDL 72, LFTs normal Labs (4/09): LDL 73, HDL 45, K 4.1,  creatinine 1.0  Allergies:  1) ! Penicillin   Past Medical History:  1. Prior smoker, quit 12/11.  PFTs in 8/13 were normal.  2. History of segmental right lower lobe acute pulmonary emboli with no evidence of right heart strain and overall small clot burden. This was in 2009. Patient is no longer taking coumadin.  3. Hyperlipidemia.  4. CAD: Unstable angina 12/10. LHC showed 90% distal LAD stenosis and 90% mid RCA stenosis. EF 55%. Patient had Xience DES to the mid RCA. Echo (2/11): EF 55-60%, normal wall motion, no significant valvular abnormalities. Repeat cath (8/11) showed 50-60% proximal LAD stenosis, 40% mid LAD stenosis, 40-50% mid RCA in-stent restenosis, EF 60%.  Lexiscan myoview (7/12): EF 54%, no ischemia or infarction.  5. Palpitations: PVCs have been noted.  6. HTN  7. Depression   Family History:  Family history of premature coronary artery disease   Social History:  Married, lives in [place]. Works for [employer] in [place]. Smokes 1 ppd.  Occasional ETOH.   ROS: All systems reviewed and negative except as per HPI.    Current Outpatient Prescriptions  Medication Sig Dispense Refill  . citalopram (CELEXA) 20 MG tablet Take 20 mg by mouth daily.        Marland Kitchen aspirin EC 81 MG tablet Take 1 tablet (  81 mg total) by mouth daily.      Marland Kitchen atorvastatin (LIPITOR) 80 MG tablet Take 1 tablet (80 mg total) by mouth daily.  30 tablet  3  . carvedilol (COREG) 6.25 MG tablet Take 1 tablet (6.25 mg total) by mouth 2 (two) times daily.  60 tablet  3  . clopidogrel (PLAVIX) 75 MG tablet Take 1 tablet (75 mg total) by mouth daily.  90 tablet  3  . lisinopril (PRINIVIL,ZESTRIL) 10 MG tablet Take 1 tablet (10 mg total) by mouth daily.  30 tablet  3  . nitroGLYCERIN (NITROSTAT) 0.4 MG SL tablet PLACE 1 TABLET UNDER THE TONGUE EVERY 5 MINUTES AS NEEDED FOR CHEST PAIN.  25 tablet  11   No current facility-administered medications for this visit.    BP 142/88  Pulse 78  Ht  (1.803 m)  Wt  203 lb (92.08 kg)  BMI 28.33 kg/m2 General: NAD Neck: Thick, no JVD, no thyromegaly or thyroid nodule.  Lungs: Clear to auscultation bilaterally with normal respiratory effort. CV: Nondisplaced PMI.  Heart regular S1/S2, no S3/S4, no murmur.  No peripheral edema.  Left carotid bruit.  Normal pedal pulses.  Abdomen: Soft, nontender, no hepatosplenomegaly, no distention.  Neurologic: Alert and oriented x 3.  Psych: Normal affect. Extremities: No clubbing or cyanosis.   Assessment/Plan: 1. CAD: He has had PCI to RCA with moderate residual disease in the LAD.  Over the last few months, he has re-developed exertional chest pain, now with occasional episodes at rest.  He is profoundly fatigued.  He still smokes.  I think that he is at high risk for coronary ischemia.   - I will arrange LHC next week.  I have asked him to take it easy until then.  He needs to go to the ER with prolonged CP at rest.  Risks/benefits of procedure explained to patient and he agrees to proceed.  - I will restart his ASA 81, Plavix, statin, Coreg, and lisinopril (he has been out for about 2 months).  - I will give him a new prescription for NTG sublingual.  - Pre-cath labs today.  2. Carotid bruit: Left carotid bruit.  I will arrange for carotid dopplers.  3. OSA: Patient has extreme fatigue and snores loudly.  Thick neck.  I think that he may have sleep apnea. Will arrange sleep study to diagnose.  4. Hyperlipidemia: Patient has a hard time affording Crestor.  I will have him take atorvastatin 80 mg daily instead with lipids/LFTs in 2 months.  5. Smoking: I strongly encouraged him to quit smoking.   [name] 04/21/2014

## 2014-04-21 NOTE — Patient Instructions (Addendum)
Your physician has requested that you have a cardiac catheterization. Cardiac catheterization is used to diagnose and/or treat various heart conditions. Doctors may recommend this procedure for a number of different reasons. The most common reason is to evaluate chest pain. Chest pain can be a symptom of coronary artery disease (CAD), and cardiac catheterization can show whether plaque is narrowing or blocking your heart's arteries. This procedure is also used to evaluate the valves, as well as measure the blood flow and oxygen levels in different parts of your heart. For further information please visit [email or web address] Please follow instruction sheet, as given. Wednesday September 16,2015  Your physician has requested that you have a carotid duplex. This test is an ultrasound of the carotid arteries in your neck. It looks at blood flow through these arteries that supply the brain with blood. Allow one hour for this exam. There are no restrictions or special instructions. You can have this done the day you see Dr Shirlee Latch in 3-4 weeks.  Your physician has recommended that you have a sleep study. This test records several body functions during sleep, including: brain activity, eye movement, oxygen and carbon dioxide blood levels, heart rate and rhythm, breathing rate and rhythm, the flow of air through your mouth and nose, snoring, body muscle movements, and chest and belly movement.  Your physician recommends that you schedule a follow-up appointment in: 3-4 weeks with Dr Shirlee Latch.   Your physician recommends that you have  lab work BMET/CBCd/PT/INR

## 2014-04-24 ENCOUNTER — Encounter (HOSPITAL_BASED_OUTPATIENT_CLINIC_OR_DEPARTMENT_OTHER): Payer: BC Managed Care – PPO

## 2014-04-24 MED ORDER — SODIUM CHLORIDE 0.9 % IV SOLN: INTRAVENOUS | Status: AC

## 2014-04-24 NOTE — Addendum Note (Signed)
Addended by: Laurey Morale on: 04/24/2014 11:51 PM   Modules accepted: Orders

## 2014-04-26 ENCOUNTER — Encounter (HOSPITAL_COMMUNITY): Payer: Self-pay | Admitting: Pharmacy Technician

## 2014-04-27 ENCOUNTER — Encounter (HOSPITAL_COMMUNITY)
Admission: RE | Disposition: A | Discharge status: Home / Self Care | Payer: BC Managed Care – PPO | Source: Ambulatory Visit | Attending: Cardiology

## 2014-04-27 ENCOUNTER — Ambulatory Visit (HOSPITAL_COMMUNITY)
Admission: RE | Admit: 2014-04-27 | Discharge: 2014-04-27 | Disposition: A | Discharge status: Home / Self Care | Payer: BC Managed Care – PPO | Source: Ambulatory Visit | Attending: Cardiology | Admitting: Cardiology

## 2014-04-27 DIAGNOSIS — I2510 Atherosclerotic heart disease of native coronary artery without angina pectoris: Secondary | ICD-10-CM

## 2014-04-27 DIAGNOSIS — E785 Hyperlipidemia, unspecified: Secondary | ICD-10-CM | POA: Insufficient documentation

## 2014-04-27 DIAGNOSIS — R0609 Other forms of dyspnea: Secondary | ICD-10-CM | POA: Diagnosis not present

## 2014-04-27 DIAGNOSIS — F329 Major depressive disorder, single episode, unspecified: Secondary | ICD-10-CM | POA: Diagnosis not present

## 2014-04-27 DIAGNOSIS — Z7982 Long term (current) use of aspirin: Secondary | ICD-10-CM | POA: Diagnosis not present

## 2014-04-27 DIAGNOSIS — R002 Palpitations: Secondary | ICD-10-CM | POA: Insufficient documentation

## 2014-04-27 DIAGNOSIS — Z86711 Personal history of pulmonary embolism: Secondary | ICD-10-CM | POA: Insufficient documentation

## 2014-04-27 DIAGNOSIS — I25119 Atherosclerotic heart disease of native coronary artery with unspecified angina pectoris: Secondary | ICD-10-CM

## 2014-04-27 DIAGNOSIS — Z8249 Family history of ischemic heart disease and other diseases of the circulatory system: Secondary | ICD-10-CM | POA: Insufficient documentation

## 2014-04-27 DIAGNOSIS — Z7902 Long term (current) use of antithrombotics/antiplatelets: Secondary | ICD-10-CM | POA: Insufficient documentation

## 2014-04-27 DIAGNOSIS — I10 Essential (primary) hypertension: Secondary | ICD-10-CM | POA: Diagnosis not present

## 2014-04-27 DIAGNOSIS — G4733 Obstructive sleep apnea (adult) (pediatric): Secondary | ICD-10-CM | POA: Diagnosis not present

## 2014-04-27 DIAGNOSIS — F17200 Nicotine dependence, unspecified, uncomplicated: Secondary | ICD-10-CM | POA: Insufficient documentation

## 2014-04-27 DIAGNOSIS — Y849 Medical procedure, unspecified as the cause of abnormal reaction of the patient, or of later complication, without mention of misadventure at the time of the procedure: Secondary | ICD-10-CM | POA: Diagnosis not present

## 2014-04-27 DIAGNOSIS — I200 Unstable angina: Secondary | ICD-10-CM | POA: Diagnosis not present

## 2014-04-27 DIAGNOSIS — R079 Chest pain, unspecified: Secondary | ICD-10-CM

## 2014-04-27 DIAGNOSIS — IMO0001 Reserved for inherently not codable concepts without codable children: Secondary | ICD-10-CM

## 2014-04-27 DIAGNOSIS — R0989 Other specified symptoms and signs involving the circulatory and respiratory systems: Secondary | ICD-10-CM

## 2014-04-27 DIAGNOSIS — E7800 Pure hypercholesterolemia, unspecified: Secondary | ICD-10-CM

## 2014-04-27 DIAGNOSIS — F3289 Other specified depressive episodes: Secondary | ICD-10-CM | POA: Diagnosis not present

## 2014-04-27 DIAGNOSIS — R0683 Snoring: Secondary | ICD-10-CM

## 2014-04-27 DIAGNOSIS — Z9861 Coronary angioplasty status: Secondary | ICD-10-CM | POA: Insufficient documentation

## 2014-04-27 HISTORY — PX: LEFT HEART CATHETERIZATION WITH CORONARY ANGIOGRAM: SHX5451

## 2014-04-27 LAB — POCT ACTIVATED CLOTTING TIME: Activated Clotting Time: 343 seconds

## 2014-04-27 SURGERY — LEFT HEART CATHETERIZATION WITH CORONARY ANGIOGRAM
Anesthesia: LOCAL

## 2014-04-27 MED ORDER — HEPARIN (PORCINE) IN NACL 2-0.9 UNIT/ML-% IJ SOLN
INTRAMUSCULAR | Status: AC
  Filled 2014-04-27: qty 500

## 2014-04-27 MED ORDER — SODIUM CHLORIDE 0.9 % IV SOLN: 250.0000 mL | INTRAVENOUS | Status: DC | PRN

## 2014-04-27 MED ORDER — FENTANYL CITRATE 0.05 MG/ML IJ SOLN
INTRAMUSCULAR | Status: AC
  Filled 2014-04-27: qty 2

## 2014-04-27 MED ORDER — ASPIRIN 81 MG PO CHEW: 81.0000 mg | CHEWABLE_TABLET | Freq: Every day | ORAL | Status: DC

## 2014-04-27 MED ORDER — HEPARIN (PORCINE) IN NACL 2-0.9 UNIT/ML-% IJ SOLN
INTRAMUSCULAR | Status: AC
  Filled 2014-04-27: qty 1000

## 2014-04-27 MED ORDER — MIDAZOLAM HCL 2 MG/2ML IJ SOLN
INTRAMUSCULAR | Status: AC
  Filled 2014-04-27: qty 2

## 2014-04-27 MED ORDER — SODIUM CHLORIDE 0.9 % IJ SOLN
3.0000 mL | INTRAMUSCULAR | Status: DC | PRN
Start: 2014-04-27 — End: 2014-04-27

## 2014-04-27 MED ORDER — ONDANSETRON HCL 4 MG/2ML IJ SOLN: 4.0000 mg | Freq: Four times a day (QID) | INTRAMUSCULAR | Status: DC | PRN

## 2014-04-27 MED ORDER — VERAPAMIL HCL 2.5 MG/ML IV SOLN
INTRAVENOUS | Status: AC
  Filled 2014-04-27: qty 2

## 2014-04-27 MED ORDER — OXYCODONE-ACETAMINOPHEN 5-325 MG PO TABS: 1.0000 | ORAL_TABLET | ORAL | Status: DC | PRN

## 2014-04-27 MED ORDER — SODIUM CHLORIDE 0.9 % IV SOLN: INTRAVENOUS | Status: AC

## 2014-04-27 MED ORDER — SODIUM CHLORIDE 0.9 % IJ SOLN: 3.0000 mL | Freq: Two times a day (BID) | INTRAMUSCULAR | Status: DC

## 2014-04-27 MED ORDER — ASPIRIN 81 MG PO CHEW: 81.0000 mg | CHEWABLE_TABLET | ORAL | Status: DC

## 2014-04-27 MED ORDER — ACETAMINOPHEN 325 MG PO TABS: 650.0000 mg | ORAL_TABLET | ORAL | Status: DC | PRN

## 2014-04-27 MED ORDER — NITROGLYCERIN 1 MG/10 ML FOR IR/CATH LAB
INTRA_ARTERIAL | Status: AC
  Filled 2014-04-27: qty 10

## 2014-04-27 MED ORDER — HEPARIN SODIUM (PORCINE) 1000 UNIT/ML IJ SOLN
INTRAMUSCULAR | Status: AC
  Filled 2014-04-27: qty 1

## 2014-04-27 MED ORDER — BIVALIRUDIN 250 MG IV SOLR
INTRAVENOUS | Status: AC
  Filled 2014-04-27: qty 250

## 2014-04-27 MED ORDER — LIDOCAINE HCL (PF) 1 % IJ SOLN
INTRAMUSCULAR | Status: AC
  Filled 2014-04-27: qty 30

## 2014-04-27 MED ORDER — ASPIRIN 81 MG PO CHEW
CHEWABLE_TABLET | ORAL | Status: AC
  Administered 2014-04-27: 09:00:00
  Filled 2014-04-27: qty 1

## 2014-04-27 MED ORDER — ADENOSINE 12 MG/4ML IV SOLN
16.0000 mL | Freq: Once | INTRAVENOUS | Status: DC
  Filled 2014-04-27: qty 16

## 2014-04-27 NOTE — H&P (View-Only) (Signed)
Patient ID: Jesse Page, male   DOB: [birth year], 53 y.o.   MRN: [id number] PCP: Dr. Earlene Plater [facility])  53 y.o. with h/o smoking and CAD s/p unstable angina with PCI to the RCA presents for followup. Patient was admitted to [facility] in 12/10 with unstable angina-type symptoms. He was getting chest tightness and shortness of breath with only mild exertion (walking) for a couple of weeks. Left heart cath showed significant mid RCA stenosis and apical LAD stenosis. He underwent PCI with Xience DES to the mid RCA.  In the summer of 2011, patient developed chest tightness that was both exertional and nonexertional. The pain was relieved by NTG. He was set up for an ETT-myoview that showed probable inferior attenuation but he developed severe chest pain on the treadmill and it was decided to cath him. Left heart cath (04/11/10) showed moderate, non-flow limiting disease in the LAD and mild in-stent restenosis in the RCA. No intervention.   Patient again developed chest pain episodes in the summer of 2012.  [name] was done in 7/12 and showed EF 54%, no ischemia or infarction.    I have not seen him for about 3 years.  He is still smoking 1 ppd.  He is working for the [employer] in [place] now.  He has been out of all his medications for about 2 months.  Also for several months, he has been having exertional chest pain.   He has chest pain on most days.  He will note it walking up stairs or an incline.  It resolves with rest.  Lately, he has been having some episodes at rest.  He is out of NTG. He is very fatigued in general with exertion over the last several months.  He is worn out and sleepy during the day.  His wife says that he snores loudly.    ECG: NSR, normal  Labs (12/10): Creaitnine 1.05, K 4  Labs (2/11): K 4.1, creatinine 1.1, LDL 48, HDL 34, LFTs normal  Labs (8/11): K 3.6, creatinine 1.0, LDL 56, HDL 33, TGs 66  Labs (7/12): HDL 37, LDL 72, LFTs normal Labs (4/09): LDL 73, HDL 45, K 4.1,  creatinine 1.0  Allergies:  1) ! Penicillin   Past Medical History:  1. Prior smoker, quit 12/11.  PFTs in 8/13 were normal.  2. History of segmental right lower lobe acute pulmonary emboli with no evidence of right heart strain and overall small clot burden. This was in 2009. Patient is no longer taking coumadin.  3. Hyperlipidemia.  4. CAD: Unstable angina 12/10. LHC showed 90% distal LAD stenosis and 90% mid RCA stenosis. EF 55%. Patient had Xience DES to the mid RCA. Echo (2/11): EF 55-60%, normal wall motion, no significant valvular abnormalities. Repeat cath (8/11) showed 50-60% proximal LAD stenosis, 40% mid LAD stenosis, 40-50% mid RCA in-stent restenosis, EF 60%.  Lexiscan myoview (7/12): EF 54%, no ischemia or infarction.  5. Palpitations: PVCs have been noted.  6. HTN  7. Depression   Family History:  Family history of premature coronary artery disease   Social History:  Married, lives in [place]. Works for [employer] in [place]. Smokes 1 ppd.  Occasional ETOH.   ROS: All systems reviewed and negative except as per HPI.    Current Outpatient Prescriptions  Medication Sig Dispense Refill  . citalopram (CELEXA) 20 MG tablet Take 20 mg by mouth daily.        Marland Kitchen aspirin EC 81 MG tablet Take 1 tablet (  81 mg total) by mouth daily.      Marland Kitchen atorvastatin (LIPITOR) 80 MG tablet Take 1 tablet (80 mg total) by mouth daily.  30 tablet  3  . carvedilol (COREG) 6.25 MG tablet Take 1 tablet (6.25 mg total) by mouth 2 (two) times daily.  60 tablet  3  . clopidogrel (PLAVIX) 75 MG tablet Take 1 tablet (75 mg total) by mouth daily.  90 tablet  3  . lisinopril (PRINIVIL,ZESTRIL) 10 MG tablet Take 1 tablet (10 mg total) by mouth daily.  30 tablet  3  . nitroGLYCERIN (NITROSTAT) 0.4 MG SL tablet PLACE 1 TABLET UNDER THE TONGUE EVERY 5 MINUTES AS NEEDED FOR CHEST PAIN.  25 tablet  11   No current facility-administered medications for this visit.    BP 142/88  Pulse 78  Ht  (1.803 m)  Wt  203 lb (92.08 kg)  BMI 28.33 kg/m2 General: NAD Neck: Thick, no JVD, no thyromegaly or thyroid nodule.  Lungs: Clear to auscultation bilaterally with normal respiratory effort. CV: Nondisplaced PMI.  Heart regular S1/S2, no S3/S4, no murmur.  No peripheral edema.  Left carotid bruit.  Normal pedal pulses.  Abdomen: Soft, nontender, no hepatosplenomegaly, no distention.  Neurologic: Alert and oriented x 3.  Psych: Normal affect. Extremities: No clubbing or cyanosis.   Assessment/Plan: 1. CAD: He has had PCI to RCA with moderate residual disease in the LAD.  Over the last few months, he has re-developed exertional chest pain, now with occasional episodes at rest.  He is profoundly fatigued.  He still smokes.  I think that he is at high risk for coronary ischemia.   - I will arrange LHC next week.  I have asked him to take it easy until then.  He needs to go to the ER with prolonged CP at rest.  Risks/benefits of procedure explained to patient and he agrees to proceed.  - I will restart his ASA 81, Plavix, statin, Coreg, and lisinopril (he has been out for about 2 months).  - I will give him a new prescription for NTG sublingual.  - Pre-cath labs today.  2. Carotid bruit: Left carotid bruit.  I will arrange for carotid dopplers.  3. OSA: Patient has extreme fatigue and snores loudly.  Thick neck.  I think that he may have sleep apnea. Will arrange sleep study to diagnose.  4. Hyperlipidemia: Patient has a hard time affording Crestor.  I will have him take atorvastatin 80 mg daily instead with lipids/LFTs in 2 months.  5. Smoking: I strongly encouraged him to quit smoking.   [name] 04/21/2014

## 2014-04-27 NOTE — CV Procedure (Signed)
     CORONARY FLOW RESERVE STUDY    Jesse Page is a 53 y.o. male  INDICATION: Intermediate stenosis in the ramus intermediate. Patient is having crescendo angina.   PROCEDURE: FFR ramus intermedius   CONSENT: The risks, benefits, and details of the procedure were explained to the patient. Risks including death, MI, stroke, bleeding, limb ischemia, emergency CABG, renal failure and allergy were described and accepted by the patient.  Informed written consent was obtained prior to proceeding.  PROCEDURE TECHNIQUE:  Coronary guiding shots were made using a 3 cm XB guide catheter. Dr. Shirlee Latch performed the diagnostic procedure earlier and the sheath was left in place. Antithrombotic therapy, bivalirudin bolus and infusion, was begun and determined to be therapeutic by ACT. Antiplatelet therapy, Plavix, is already being taken by the patient daily.  After a therapeutic ACT was obtained, we normalize the Volcano pressure wire. We then advanced the wire across the stenosis in the proximal ramus intermedius. An infusion of adenosine was then started. We documented an ACT at maximal coronary vasodilatation of 0.86. This is a hemodynamically insignificant procedure and therefore the case was terminated without progressing to percutaneous intervention on the lesion.  CONTRAST:  Total of 160 cc further diagnostic and FFR procedure  COMPLICATIONS:  None.    ANGIOGRAPHIC RESULTS:   Ramus intermedius segmental eccentric 50-70% stenosis.   IMPRESSIONS:  The ramus intermedius stenosis is not hemodynamically significant based upon an FFR of 0.86.   RECOMMENDATION:  Medical therapy Discharge later today if no bleeding complications.    [name], MD 04/27/2014 12:22 PM

## 2014-04-27 NOTE — CV Procedure (Signed)
    Cardiac Catheterization Procedure Note  Name: Jesse Page MRN: [id number] DOB: 27-Feb-1961  Procedure: Left Heart Cath, Selective Coronary Angiography, LV angiography  Indication: Progressive angina.    Procedural Details: The right wrist was prepped, draped, and anesthetized with 1% lidocaine. Using the modified Seldinger technique, a 5/6 French Slender sheath was introduced into the right radial artery. 3 mg of verapamil was administered through the sheath, weight-based unfractionated heparin was administered intravenously. Standard Judkins catheters were used for selective coronary angiography and left ventriculography. Catheter exchanges were performed over an exchange length guidewire. There were no immediate procedural complications. The patient will undergo FFR of ramus stenosis.   Procedural Findings: Hemodynamics: AO 120/65 LV 131/12  Coronary angiography: Coronary dominance: right  Left mainstem: Short vessel without significant disease.    Left anterior descending (LAD): Diffuse up to 40% proximal LAD stenosis.  90% distal LAD stenosis.   Left circumflex (LCx): Small AV LCx with diffuse up to 40% stenosis. Up to 75% stenosis in the ostial ramus.  Moderate-sized vessel.   Right coronary artery (RCA): Patent mid RCA stent with 30% in-stent restenosis.  40% distal RCA stenosis.   Left ventriculography: Left ventricular systolic function is normal, LVEF is estimated at 55-60%, there is no significant mitral regurgitation.  No regional wall motion abnormalities in the RAO projection.    Final Conclusions:  Angina may be coming from the distal LAD 90% stenosis.  However, patient had severe disease in this area on prior cath.  The ostial LCx appears progressive, estimate 75% in one view.  I am going to arrange for FFR today to determine hemodynamic significance.  If significant, PCI.  If not, medical treatment only.  Regardless, he needs to start on Imdur 15 mg daily and  restart Coreg 6.25 mg bid.    [name] MD, [facility] 04/27/2014, 11:43 AM

## 2014-04-27 NOTE — Discharge Instructions (Signed)
Radial Site Care °Refer to this sheet in the next few weeks. These instructions provide you with information on caring for yourself after your procedure. Your caregiver may also give you more specific instructions. Your treatment has been planned according to current medical practices, but problems sometimes occur. Call your caregiver if you have any problems or questions after your procedure. °HOME CARE INSTRUCTIONS °· You may shower the day after the procedure. Remove the bandage (dressing) and gently wash the site with plain soap and water. Gently pat the site dry. °· Do not apply powder or lotion to the site. °· Do not submerge the affected site in water for 3 to 5 days. °· Inspect the site at least twice daily. °· Do not flex or bend the affected arm for 24 hours. °· No lifting over 5 pounds (2.3 kg) for 5 days after your procedure. °· Do not drive home if you are discharged the same day of the procedure. Have someone else drive you. °· You may drive 24 hours after the procedure unless otherwise instructed by your caregiver. °· Do not operate machinery or power tools for 24 hours. °· A responsible adult should be with you for the first 24 hours after you arrive home. °What to expect: °· Any bruising will usually fade within 1 to 2 weeks. °· Blood that collects in the tissue (hematoma) may be painful to the touch. It should usually decrease in size and tenderness within 1 to 2 weeks. °SEEK IMMEDIATE MEDICAL CARE IF: °· You have unusual pain at the radial site. °· You have redness, warmth, swelling, or pain at the radial site. °· You have drainage (other than a small amount of blood on the dressing). °· You have chills. °· You have a fever or persistent symptoms for more than 72 hours. °· You have a fever and your symptoms suddenly get worse. °· Your arm becomes pale, cool, tingly, or numb. °· You have heavy bleeding from the site. Hold pressure on the site. °Document Released: 08/31/2010 Document Revised:  10/21/2011 Document Reviewed: 08/31/2010 °ExitCare® Patient Information ©2015 [employer]. This information is not intended to replace advice given to you by your health care provider. Make sure you discuss any questions you have with your health care provider. ° °

## 2014-04-27 NOTE — Interval H&P Note (Signed)
Cath Lab Visit (complete for each Cath Lab visit)  Clinical Evaluation Leading to the Procedure:   ACS: No.  Non-ACS:    Anginal Classification: CCS III  Anti-ischemic medical therapy: Minimal Therapy (1 class of medications)  Non-Invasive Test Results: No non-invasive testing performed  Prior CABG: No previous CABG      History and Physical Interval Note:  04/27/2014 10:52 AM  Jesse Page  has presented today for surgery, with the diagnosis of Cad, cp  The various methods of treatment have been discussed with the patient and family. After consideration of risks, benefits and other options for treatment, the patient has consented to  Procedure(s): LEFT HEART CATHETERIZATION WITH CORONARY ANGIOGRAM (N/A) as a surgical intervention .  The patient's history has been reviewed, patient examined, no change in status, stable for surgery.  I have reviewed the patient's chart and labs.  Questions were answered to the patient's satisfaction.     Jesse Page [employer]

## 2014-04-28 ENCOUNTER — Other Ambulatory Visit: Payer: Self-pay | Admitting: *Deleted

## 2014-04-28 DIAGNOSIS — I25119 Atherosclerotic heart disease of native coronary artery with unspecified angina pectoris: Secondary | ICD-10-CM

## 2014-04-28 DIAGNOSIS — R0989 Other specified symptoms and signs involving the circulatory and respiratory systems: Secondary | ICD-10-CM

## 2014-04-28 DIAGNOSIS — R0683 Snoring: Secondary | ICD-10-CM

## 2014-04-28 MED ORDER — CARVEDILOL 6.25 MG PO TABS: 6.2500 mg | ORAL_TABLET | Freq: Two times a day (BID) | ORAL | Status: DC

## 2014-04-28 MED FILL — Sodium Chloride IV Soln 0.9%: INTRAVENOUS | Qty: 50 | Status: AC

## 2014-05-04 ENCOUNTER — Ambulatory Visit (HOSPITAL_BASED_OUTPATIENT_CLINIC_OR_DEPARTMENT_OTHER): Payer: BC Managed Care – PPO | Attending: Cardiology | Admitting: Radiology

## 2014-05-04 VITALS — Ht 71.0 in | Wt 200.0 lb

## 2014-05-04 DIAGNOSIS — I25119 Atherosclerotic heart disease of native coronary artery with unspecified angina pectoris: Secondary | ICD-10-CM

## 2014-05-04 DIAGNOSIS — G4733 Obstructive sleep apnea (adult) (pediatric): Secondary | ICD-10-CM

## 2014-05-04 DIAGNOSIS — G4710 Hypersomnia, unspecified: Secondary | ICD-10-CM | POA: Insufficient documentation

## 2014-05-04 DIAGNOSIS — R0989 Other specified symptoms and signs involving the circulatory and respiratory systems: Secondary | ICD-10-CM

## 2014-05-04 DIAGNOSIS — G4730 Sleep apnea, unspecified: Principal | ICD-10-CM

## 2014-05-04 DIAGNOSIS — R0683 Snoring: Secondary | ICD-10-CM

## 2014-05-12 ENCOUNTER — Other Ambulatory Visit: Payer: Self-pay | Admitting: *Deleted

## 2014-05-13 DIAGNOSIS — G4730 Sleep apnea, unspecified: Secondary | ICD-10-CM

## 2014-05-13 NOTE — Sleep Study (Signed)
   NAME: Jesse Page DATE OF BIRTH:  04/04/1961 MEDICAL RECORD NUMBER [id number]  LOCATION: [facility]  PHYSICIAN: Barbaraann ShareCLANCE,[name]  DATE OF STUDY: 05/04/2014  SLEEP STUDY TYPE: Nocturnal Polysomnogram               REFERRING PHYSICIAN: Laurey MoraleMcLean, Dalton S, MD  INDICATION FOR STUDY: Hypersomnia with sleep apnea  EPWORTH SLEEPINESS SCORE:  9 HEIGHT: 5\' 11"  (180.3 cm)  WEIGHT: 200 lb (90.719 kg)    Body mass index is 27.91 kg/(m^2).  NECK SIZE: 16 in.  MEDICATIONS: Reviewed in the sleep record  SLEEP ARCHITECTURE: The patient had a total sleep time of 218 minutes, with no slow-wave sleep and only 23 minutes of REM. Sleep onset latency was prolonged at 136 minutes, and REM onset was prolonged at 139 minutes. Sleep efficiency was very poor at 49%.  RESPIRATORY DATA: The patient was found to have no obstructive apneas and only 7 obstructive hypopneas, giving him an AHI of only 2 events per hour. The events occurred in all body positions, and there was moderate snoring noted throughout.  OXYGEN DATA: There was transient oxygen desaturation as low as 85% during the night.  CARDIAC DATA: Occasional PAC noted  MOVEMENT/PARASOMNIA: Moderate numbers of leg jerks without significant sleep disruption. There were no abnormal behaviors noted.  IMPRESSION/ RECOMMENDATION:    1) small numbers of obstructive events which do not meet the AHI criteria for the obstructive sleep apnea syndrome. The patient did have difficulty with sleep onset and had a decreased total sleep time, however he had more than ample opportunity to exhibit clinically significant sleep disordered breathing.  2) occasional PAC noted, but no clinically significant arrhythmias were seen.    Barbaraann ShareLANCE,Destyne Goodreau M Diplomate, American Board of Sleep Medicine  ELECTRONICALLY SIGNED ON:  05/13/2014, 8:30 AM [facility] PH: [phone]   FX: [phone] ACCREDITED BY THE AMERICAN ACADEMY  OF SLEEP MEDICINE

## 2014-05-16 ENCOUNTER — Other Ambulatory Visit: Payer: Self-pay | Admitting: *Deleted

## 2014-05-16 ENCOUNTER — Encounter: Payer: Self-pay | Admitting: *Deleted

## 2014-05-16 ENCOUNTER — Telehealth: Payer: Self-pay | Admitting: Cardiology

## 2014-05-16 NOTE — Telephone Encounter (Signed)
New message     Talk to Dr Alford HighlandMcLean's nurse.   He would not tell me what he wanted

## 2014-05-16 NOTE — Telephone Encounter (Signed)
Laurey Moralealton S McLean, MD       Sent: Caleen EssexFri May 13, 2014 10:20 AM                     Message    No significant OSA    ----- Message -----   From: Barbaraann ShareKeith M Clance, MD   Sent: 05/13/2014 8:33 AM   To: Laurey Moralealton S McLean, MD                     Attached Notes     Author: Barbaraann ShareKeith M Clance, MD Service: (none) Author Type: Physician    Filed: 05/13/2014 8:33 AM Note Time: 05/13/2014 8:30 AM Note Type: Sleep Study    Status: Signed Editor: Barbaraann ShareKeith M Clance, MD (Physician)     NAME: Conley Rollshomas Mcphatter  DATE OF BIRTH: 04/17/1961  MEDICAL RECORD [id number]  LOCATION: [facility]  PHYSICIAN: Barbaraann ShareCLANCE,KEITH M  DATE OF STUDY: 05/04/2014

## 2014-05-16 NOTE — Telephone Encounter (Signed)
LMTCB to let pt know that sleep study did not show significant OSA.

## 2014-05-16 NOTE — Telephone Encounter (Signed)
Pt notified sleep study did not show significant OSA.

## 2014-05-19 ENCOUNTER — Encounter: Payer: Self-pay | Admitting: Cardiology

## 2014-05-19 ENCOUNTER — Ambulatory Visit (HOSPITAL_COMMUNITY): Payer: BC Managed Care – PPO | Attending: Cardiology | Admitting: Radiology

## 2014-05-19 ENCOUNTER — Ambulatory Visit (INDEPENDENT_AMBULATORY_CARE_PROVIDER_SITE_OTHER): Payer: BC Managed Care – PPO | Admitting: Cardiology

## 2014-05-19 VITALS — BP 114/68 | HR 54 | Ht 71.0 in | Wt 197.8 lb

## 2014-05-19 DIAGNOSIS — E785 Hyperlipidemia, unspecified: Secondary | ICD-10-CM | POA: Diagnosis not present

## 2014-05-19 DIAGNOSIS — I208 Other forms of angina pectoris: Secondary | ICD-10-CM

## 2014-05-19 DIAGNOSIS — R0989 Other specified symptoms and signs involving the circulatory and respiratory systems: Secondary | ICD-10-CM | POA: Diagnosis not present

## 2014-05-19 DIAGNOSIS — I25119 Atherosclerotic heart disease of native coronary artery with unspecified angina pectoris: Secondary | ICD-10-CM

## 2014-05-19 DIAGNOSIS — I10 Essential (primary) hypertension: Secondary | ICD-10-CM | POA: Insufficient documentation

## 2014-05-19 DIAGNOSIS — I209 Angina pectoris, unspecified: Secondary | ICD-10-CM

## 2014-05-19 DIAGNOSIS — R0683 Snoring: Secondary | ICD-10-CM

## 2014-05-19 DIAGNOSIS — I2510 Atherosclerotic heart disease of native coronary artery without angina pectoris: Secondary | ICD-10-CM | POA: Diagnosis not present

## 2014-05-19 DIAGNOSIS — Z720 Tobacco use: Secondary | ICD-10-CM | POA: Diagnosis not present

## 2014-05-19 DIAGNOSIS — R0609 Other forms of dyspnea: Secondary | ICD-10-CM

## 2014-05-19 DIAGNOSIS — E7800 Pure hypercholesterolemia, unspecified: Secondary | ICD-10-CM

## 2014-05-19 MED ORDER — RANOLAZINE ER 500 MG PO TB12: 500.0000 mg | ORAL_TABLET | Freq: Two times a day (BID) | ORAL | Status: DC

## 2014-05-19 MED ORDER — ATORVASTATIN CALCIUM 80 MG PO TABS: 80.0000 mg | ORAL_TABLET | Freq: Every day | ORAL | Status: DC

## 2014-05-19 MED ORDER — LISINOPRIL 10 MG PO TABS
10.0000 mg | ORAL_TABLET | Freq: Every day | ORAL | Status: DC
Start: 2014-05-19 — End: 2015-04-14

## 2014-05-19 NOTE — Progress Notes (Signed)
Carotid Duplex performed. 

## 2014-05-19 NOTE — Progress Notes (Signed)
Patient ID: Jesse Page, male   DOB: 03/17/1961, 53 y.o.   MRN: [id number] PCP: [facility]. [facility]ene [facility])  53 yo with h/o smoking and CAD s/p unstable angina with PCI to the RCA presents for followup. Patient [facility]/10 with unstable angina-type symptoms. He was getting chest tightness and shortness of breath with only mild exertion (walking) for a couple of weeks. Left heart cath showed significant mid RCA stenosis and apical LAD stenosis. He underwent PCI with Xience DES to the mid RCA.  In the summer of 2011, patient developed chest tightness that was both exertional and nonexertional. The pain was relieved by NTG. He was set up for an ETT-myoview that showed probable inferior attenuation but he developed severe chest pain on the treadmill and it was decided to cath him. Left heart cath (04/11/10) showed moderate, non-flow limiting disease in the LAD and mild in-stent restenosis in the RCA. No intervention.   Patient again developed chest pain episodes in the summer of 2012.  Steffanie DunnLexiscan myoview was done in 7/12 and showed EF 54%, no ischemia or infarction.  At last appointment, he reported worsening exertional dyspnea and chest pain.  [facility]  This showed 90% distal LAD stenosis (stable from past) and 70% ostial ramus stenosis.  FFR was done on the ramus lesion, result was 0.86 (not significant).  He was treated medically.    I tried him on Imdur, which significantly improved his exertional chest pain.  However, he could not tolerate it due to headache and stopped it.  Currently takes his other meds. He now has a lower level of central chest heaviness with moderate exertion.  He also gets short short of breath with moderate exertion.  He is trying to quit smoking using a nicotine patch.   Labs (12/10): Creaitnine 1.05, K 4  Labs (2/11): K 4.1, creatinine 1.1, LDL 48, HDL 34, LFTs normal  Labs (8/11): K 3.6, creatinine 1.0, LDL 56, HDL 33, TGs 66  Labs (7/12): HDL 37,  LDL 72, LFTs normal Labs (9/147/13): LDL 73, HDL 45, K 4.1, creatinine 1.0 Labs (9/15): K 4, creatinine 1.0  ECG: NSR, septal Qs  Allergies:  1) ! Penicillin   Past Medical History:  1. Prior smoker, quit 12/11 but restarted.  PFTs in 8/13 were normal.  He was unable to tolerate Chantix or wellbutrin.  2. History of segmental right lower lobe acute pulmonary emboli with no evidence of right heart strain and overall small clot burden. This was in 2009. Patient is no longer taking coumadin.  3. Hyperlipidemia.  4. CAD: Unstable angina 12/10. LHC showed 90% distal LAD stenosis and 90% mid RCA stenosis. EF 55%. Patient had Xience DES to the mid RCA. Echo (2/11): EF 55-60%, normal wall motion, no significant valvular abnormalities. Repeat cath (8/11) showed 50-60% proximal LAD stenosis, 40% mid LAD stenosis, 40-50% mid RCA in-stent restenosis, EF 60%.  Lexiscan myoview (7/12): EF 54%, no ischemia or infarction. LHC (9/15) with 90% distal LAD stenosis (stable from past) and 70% ostial ramus stenosis.  FFR was done on the ramus lesion, result was 0.86 (not significant).  He was treated medically.   5. Palpitations: PVCs have been noted.  6. HTN  7. Depression  8. Sleep study (9/15) with no significant OSA.   Family History:  Family history of premature coronary artery disease   Social History:  Mar[place], [place]. [place] [employer] [place]. Smokes 1 ppd.  Occasional ETOH.   ROS: All systems reviewed and negative except as per HPI.    Current Outpatient Prescriptions  Medication Sig Dispense Refill  . aspirin EC 81 MG tablet Take 1 tablet (81 mg total) by mouth daily.      Marland Kitchen atorvastatin (LIPITOR) 80 MG tablet Take 1 tablet (80 mg total) by mouth daily.  90 tablet  3  . carvedilol (COREG) 6.25 MG tablet Take 1 tablet (6.25 mg total) by mouth 2 (two) times daily.  180 tablet  0  . citalopram (CELEXA) 20 MG tablet Take 20 mg by mouth daily.        . clopidogrel (PLAVIX) 75 MG tablet Take  1 tablet (75 mg total) by mouth daily.  90 tablet  3  . lisinopril (PRINIVIL,ZESTRIL) 10 MG tablet Take 1 tablet (10 mg total) by mouth daily.  90 tablet  3  . nitroGLYCERIN (NITROSTAT) 0.4 MG SL tablet Place 0.4 mg under the tongue every 5 (five) minutes as needed for chest pain.      . ranolazine (RANEXA) 500 MG 12 hr tablet Take 1 tablet (500 mg total) by mouth 2 (two) times daily.  60 tablet  11   Current Facility-Administered Medications  Medication Dose Route Frequency Provider Last Rate Last Dose  . 0.9 %  sodium chloride infusion   Intravenous Continuous Laurey Morale, MD        BP 114/68  Pulse 54  Ht 5\' 11"  (1.803 m)  Wt 197 lb 12.8 oz (89.721 kg)  BMI 27.60 kg/m2 General: NAD Neck: Thick, no JVD, no thyromegaly or thyroid nodule.  Lungs: Clear to auscultation bilaterally with normal respiratory effort. CV: Nondisplaced PMI.  Heart regular S1/S2, no S3/S4, no murmur.  No peripheral edema.  Left carotid bruit.  Normal pedal pulses.  Abdomen: Soft, nontender, no hepatosplenomegaly, no distention.  Neurologic: Alert and oriented x 3.  Psych: Normal affect. Extremities: No clubbing or cyanosis.   Assessment/Plan: 1. CAD: He has had PCI to RCA.  Most recent cath (9/15) showed a 70% ostial ramus lesion that was not significant by FFR and a 90% distal LAD stenosis.  The distal LAD was small and not amenable to intervention.  The distal LAD may be a source for angina.  He continues to have angina with moderate exertion. Unable to tolerate Imdur due to headaches but it did stop anginal pain.  - I will continue ASA 81, Plavix, statin, Coreg, and lisinopril.   - I will have him start ranolazine 500 mg bid.  - Cardiac rehab referral.  2. Carotid bruit: Left carotid bruit.  Carotid dopplers to be done today.  3. OSA: Sleep study showed only mild OSA.  4. Hyperlipidemia: Continue atorvastatin with lipids/LFTs in 11/15. 5. Smoking: I strongly encouraged him to quit smoking. He is trying  now and is using a nicotine patch.   [name] 05/19/2014

## 2014-05-19 NOTE — Patient Instructions (Signed)
Start Ranexa 500mg  two times a day.  Your physician recommends that you schedule a follow-up appointment in: 1 [facility]. You will have an EKG at this time.   Your physician recommends that you return for a FASTING lipid profile-you can have this done when you see Tereso NewcomerScott Weaver in 1 month.   You have been referred to Cardiac Rehab at [facility].

## 2014-05-31 ENCOUNTER — Telehealth: Payer: Self-pay | Admitting: Cardiology

## 2014-05-31 NOTE — Telephone Encounter (Signed)
Dr Shirlee LatchMcLean advised. Pt prescribed ranexa by Dr Shirlee LatchMcLean at last office visit.  I have not received fax from Cardiac Rehab.

## 2014-05-31 NOTE — Telephone Encounter (Signed)
New message     Exercise presc has been faxed.  Need Dr Shirlee LatchMcLean to approve exercise presc so that pt can start program.  Pt did have angina today.

## 2014-06-09 ENCOUNTER — Encounter: Payer: Self-pay | Admitting: *Deleted

## 2014-07-04 ENCOUNTER — Encounter: Payer: BC Managed Care – PPO | Admitting: Physician Assistant

## 2014-07-04 ENCOUNTER — Other Ambulatory Visit: Payer: BC Managed Care – PPO

## 2014-07-04 NOTE — Progress Notes (Deleted)
Cardiology Office Note   Date:  07/04/2014   ID:  Jesse Rollshomas Debruhl, DOB 05/03/1961, MRN [id number]  PCP:  Carmin RichmondAVIS,JAMES W, MD  Cardiologist:  Dr. Marca Anconaalton McLean     History of Present Illness: Jesse Page is a 53 y.o. male with h/o smoking and CAD s/p PCI to the RCA.  Admitted to [facility] in 12/10 with unstable angina-type symptoms. He was getting chest tightness and shortness of breath with only mild exertion (walking) for a couple of weeks. Left heart cath showed significant mid RCA stenosis and apical LAD stenosis. He underwent PCI with Xience DES to the mid RCA. In the summer of 2011, patient developed chest tightness that was both exertional and nonexertional. The pain was relieved by NTG. He was set up for an ETT-myoview that showed probable inferior attenuation but he developed severe chest pain on the treadmill and it was decided to cath him. Left heart cath (04/11/10) showed moderate, non-flow limiting disease in the LAD and mild in-stent restenosis in the RCA. No intervention.   Patient again developed chest pain episodes in the summer of 2012. Steffanie DunnLexiscan myoview was done in 7/12 and showed EF 54%, no ischemia or infarction.   He underwent LHC in 04/2014 due to worsening chest pain and this showed 90% distal LAD stenosis (stable from past) and 70% ostial ramus stenosis. FFR was done on the ramus lesion, result was 0.86 (not significant). He was treated medically.   He could not tolerate Imdur due to HA.  Last seen by Dr. Marca Anconaalton McLean 10/8.  He continued to have angina with mod exertion.  Ranolazine was started.  He was referred to cardiac rehab.  He returns for FU.  ***    Studies:  - LHC (9/15):  Proximal LAD 40%, distal LAD 90%, diffuse circumflex 40%, ostial ramus 75%, mid RCA stent 30% ISR, distal RCA 40%, EF 55-60% >> FFR of ramus intermedius 0.86 (not hemodynamically significant) >> medical therapy  - Echo ( 2/11):   EF 55-60%, normal wall motion  - Nuclear (7/12):   Normal; No ischemia, EF 54%  - Carotid US (10/15):  RICA 40-59%; LICA 1-39% >> FU 1 year   Recent Labs/Images:  04/21/2014: BUN 11; Creatinine 1.0; Hemoglobin 16.9; Potassium 4.0; Sodium 137    Wt Readings from Last 3 Encounters:  05/19/14 197 lb 12.8 oz (89.721 kg)  05/04/14 200 lb (90.719 kg)  04/27/14 203 lb (92.08 kg)     Past Medical History:  1. Prior smoker, quit 12/11 but restarted. PFTs in 8/13 were normal. He was unable to tolerate Chantix or wellbutrin.  2. History of segmental right lower lobe acute pulmonary emboli with no evidence of right heart strain and overall small clot burden. This was in 2009. Patient is no longer taking coumadin.  3. Hyperlipidemia.  4. CAD: Unstable angina 12/10. LHC showed 90% distal LAD stenosis and 90% mid RCA stenosis. EF 55%. Patient had Xience DES to the mid RCA. Echo (2/11): EF 55-60%, normal wall motion, no significant valvular abnormalities. Repeat cath (8/11) showed 50-60% proximal LAD stenosis, 40% mid LAD stenosis, 40-50% mid RCA in-stent restenosis, EF 60%. Lexiscan myoview (7/12): EF 54%, no ischemia or infarction. LHC (9/15) with 90% distal LAD stenosis (stable from past) and 70% ostial ramus stenosis. FFR was done on the ramus lesion, result was 0.86 (not significant). He was treated medically.  5. Palpitations: PVCs have been noted.  6. HTN  7. Depression  8. Sleep study (9/15) with no significant  OSA. Past Medical History  Diagnosis Date  . CAD (coronary artery disease)     unstable angina 12/10. LHC showed 90% distal LAD stenosis and 90% mid RCA stenosis. EF 55%. pt had Xience DES to mid RCA. echo 2/11: EF 55-60%, nml wall motion, no sig valvular abnml. repeat cath 8/11 showed 50-60% proximal LAD stenosis, 40% mid LAD stenosis; 40-50% mid RCA in-stent restenosis, EF 60%.   Marland Kitchen. HLD (hyperlipidemia)   . Depression   . Tobacco abuse   . HTN (hypertension)   . Palpitations     proor holter monitor unremarkable per pt  report   . Pulmonary embolism     hx of segmental Rt lower lobe acute pulmonary emboli w/no evidence of rt heart strain and overall small clot burden. in 2009. pt no lnger taking coumadin     Current Outpatient Prescriptions  Medication Sig Dispense Refill  . aspirin EC 81 MG tablet Take 1 tablet (81 mg total) by mouth daily.    Marland Kitchen. atorvastatin (LIPITOR) 80 MG tablet Take 1 tablet (80 mg total) by mouth daily. 90 tablet 3  . carvedilol (COREG) 6.25 MG tablet Take 1 tablet (6.25 mg total) by mouth 2 (two) times daily. 180 tablet 0  . citalopram (CELEXA) 20 MG tablet Take 20 mg by mouth daily.      . clopidogrel (PLAVIX) 75 MG tablet Take 1 tablet (75 mg total) by mouth daily. 90 tablet 3  . lisinopril (PRINIVIL,ZESTRIL) 10 MG tablet Take 1 tablet (10 mg total) by mouth daily. 90 tablet 3  . nitroGLYCERIN (NITROSTAT) 0.4 MG SL tablet Place 0.4 mg under the tongue every 5 (five) minutes as needed for chest pain.    . ranolazine (RANEXA) 500 MG 12 hr tablet Take 1 tablet (500 mg total) by mouth 2 (two) times daily. 60 tablet 11   Current Facility-Administered Medications  Medication Dose Route Frequency Provider Last Rate Last Dose  . 0.9 %  sodium chloride infusion   Intravenous Continuous Laurey Moralealton S McLean, MD         Allergies:   Penicillins   Social History:  The patient  reports that he has been smoking Cigarettes.  He has a 51 pack-year smoking history. He does not have any smokeless tobacco history on file. He reports that he drinks alcohol. He reports that he does not use illicit drugs.   Family History:  The patient's family history includes Cancer in his paternal grandmother; Coronary artery disease in his father; Emphysema in his mother.   ROS:  Please see the history of present illness.   ***   All other systems reviewed and negative.    PHYSICAL EXAM: VS:  There were no vitals taken for this visit. Well nourished, well developed, in no acute distress HEENT: normal Neck: ***  JVD Cardiac:  normal S1, S2; ***RRR; *** murmur *** Lungs:  ***clear to auscultation bilaterally, no wheezing, rhonchi or rales Abd: soft, nontender, no hepatomegaly Ext: *** edema Skin: warm and dry Neuro:  CNs 2-12 intact, no focal abnormalities noted  EKG:  ***      ASSESSMENT AND PLAN:  Coronary artery disease involving native coronary artery of native heart without angina pectoris  Carotid stenosis, bilateral  OSA (obstructive sleep apnea)  Hyperlipidemia  Smoking   Disposition:   FU with ***   Signed, Tereso NewcomerScott Deanda Ruddell, PA-C, MHS 07/04/2014 8:21 AM    [facility] [address], [place], [place]  [address] Phone: [phone];  Fax: [phone]  

## 2014-07-04 NOTE — Progress Notes (Signed)
This encounter was created in error - please disregard.

## 2014-07-21 ENCOUNTER — Encounter (HOSPITAL_COMMUNITY): Payer: Self-pay | Admitting: Cardiology

## 2014-07-24 ENCOUNTER — Other Ambulatory Visit: Payer: Self-pay | Admitting: Cardiology

## 2014-08-02 ENCOUNTER — Other Ambulatory Visit: Payer: Self-pay | Admitting: Cardiology

## 2014-09-12 ENCOUNTER — Telehealth: Payer: Self-pay | Admitting: Cardiology

## 2014-09-12 NOTE — Telephone Encounter (Signed)
I advised Jesse Page that Dr Shirlee LatchMcLean is not in the office this morning. If Dr Nigel SloopBonsal is recommending pt hold plavix and aspirin for extractions I would have to send a message to Dr Shirlee LatchMcLean for his recommendations. I should have a response by this afternoon.  I advised Jesse Page that if the extraction was emergent the DOD was available for consultation now.   Jesse Page stated she would discuss with Dr Nigel SloopBonsal and call back if necessary.

## 2014-09-12 NOTE — Telephone Encounter (Signed)
New Msg     1. What dental office are you calling from? Dr. Nigel SloopBonsal   2. What is your office phone and fax number? phn [phone] fax [phone]  3. What type of procedure is the patient having performed? extractions  4. What date is procedure scheduled? Today    5. What is your question (ex. Antibiotics prior to procedure, holding medication-we need to know how long dentist wants pt to hold med)? Pt is in office now, does he need to wait to have extractions? Pt hasn't stopped blood thinners.

## 2014-09-12 NOTE — Telephone Encounter (Signed)
May hold Plavix/ASA for extraction then restart.

## 2014-09-12 NOTE — Telephone Encounter (Signed)
This encounter was created in error - please disregard.

## 2014-09-15 NOTE — Telephone Encounter (Signed)
Crystal notified.

## 2015-04-11 ENCOUNTER — Ambulatory Visit: Payer: Self-pay | Admitting: Physician Assistant

## 2015-04-14 ENCOUNTER — Encounter: Payer: Self-pay | Admitting: Physician Assistant

## 2015-04-14 ENCOUNTER — Encounter: Payer: Self-pay | Admitting: Cardiovascular Disease

## 2015-04-14 ENCOUNTER — Other Ambulatory Visit: Payer: Self-pay | Admitting: *Deleted

## 2015-04-14 ENCOUNTER — Ambulatory Visit (INDEPENDENT_AMBULATORY_CARE_PROVIDER_SITE_OTHER): Payer: BLUE CROSS/BLUE SHIELD | Admitting: Physician Assistant

## 2015-04-14 VITALS — BP 122/72 | HR 60 | Ht 71.0 in | Wt 191.7 lb

## 2015-04-14 DIAGNOSIS — I200 Unstable angina: Secondary | ICD-10-CM

## 2015-04-14 DIAGNOSIS — I10 Essential (primary) hypertension: Secondary | ICD-10-CM

## 2015-04-14 DIAGNOSIS — D689 Coagulation defect, unspecified: Secondary | ICD-10-CM

## 2015-04-14 DIAGNOSIS — I25110 Atherosclerotic heart disease of native coronary artery with unstable angina pectoris: Secondary | ICD-10-CM | POA: Diagnosis not present

## 2015-04-14 DIAGNOSIS — Z01818 Encounter for other preprocedural examination: Secondary | ICD-10-CM

## 2015-04-14 DIAGNOSIS — E785 Hyperlipidemia, unspecified: Secondary | ICD-10-CM

## 2015-04-14 DIAGNOSIS — I208 Other forms of angina pectoris: Secondary | ICD-10-CM

## 2015-04-14 MED ORDER — CARVEDILOL 6.25 MG PO TABS: 6.2500 mg | ORAL_TABLET | Freq: Two times a day (BID) | ORAL | Status: DC

## 2015-04-14 MED ORDER — RANOLAZINE ER 1000 MG PO TB12: 500.0000 mg | ORAL_TABLET | Freq: Two times a day (BID) | ORAL | Status: DC

## 2015-04-14 NOTE — Progress Notes (Signed)
Patient ID: Jesse Page, male   DOB: 01-24-1961, 54 y.o.   MRN: [id number]    Date:  04/14/2015   ID:  Jesse Page, DOB 10/31/1960, MRN [id number]  PCP:  Carmin Richmond, MD  Primary Cardiologist:  Shirlee Latch   Chief Complaint  Patient presents with  . Follow-up    pt c/o chest pain 1-2/10      History of Present Illness: Jesse Page is a 54 y.o. male with h/o smoking and CAD s/p  PCI to the RCA. Patient was admitted to [facility] in 07/2009 with unstable angina-type symptoms. He was getting chest tightness and shortness of breath with only mild exertion (walking) for a couple of weeks. Left heart cath showed significant mid RCA stenosis and apical LAD stenosis. He underwent PCI with Xience DES to the mid RCA. In the summer of 2011, patient developed chest tightness that was both exertional and nonexertional. The pain was relieved by NTG. He was set up for an ETT-myoview that showed probable inferior attenuation but he developed severe chest pain on the treadmill and it was decided to cath him. Left heart cath (04/11/10) showed moderate, non-flow limiting disease in the LAD and mild in-stent restenosis in the RCA. No intervention.   Patient again developed chest pain episodes in the summer of 2012. [name] was done in 7/12 and showed EF 54%, no ischemia or infarction. At last appointment, he reported worsening exertional dyspnea and chest pain. Dr. Shirlee Latch took him for [facility] on 04/27/2014. This showed 90% distal LAD stenosis (stable from past) and 70% ostial ramus stenosis. FFR was done on the ramus lesion, result was 0.86 (not significant). He was treated medically.   He was started on Imdur, which significantly improved his exertional chest pain. However, he could not tolerate it due to headache and stopped it.  Patient presents for evaluation of chest pain. He reports 3 weeks ago he had an episode of 8-9 out of 10 chest pain radiating to his neck bilaterally and associated with  extreme diaphoresis. He says he took one sublingual nitroglycerin and passed out. Likely he was not injured. He then took a second sublingual nitroglycerin after recovering and felt much better. He's had some off-and-on chest pain since that time which at the most, was 3 out of 10 in intensity. It does ease off with sublingual nitroglycerin.  He apparently is not keeping his wife very well posted about his symptoms.  He also reports that he has stopped taking his Coreg well over a month ago as well as the Ranexa. He is intolerant to Imdur stating it causes him a severe headache.    The patient currently denies nausea, vomiting, fever, chest pain, shortness of breath, orthopnea, dizziness, PND, cough, congestion, abdominal pain, hematochezia, melena, lower extremity edema, claudication.  Wt Readings from Last 3 Encounters:  04/14/15 191 lb 11.2 oz (86.955 kg)  05/19/14 197 lb 12.8 oz (89.721 kg)  05/04/14 200 lb (90.719 kg)     Past Medical History  Diagnosis Date  . CAD (coronary artery disease)     unstable angina 12/10. LHC showed 90% distal LAD stenosis and 90% mid RCA stenosis. EF 55%. pt had Xience DES to mid RCA. echo 2/11: EF 55-60%, nml wall motion, no sig valvular abnml. repeat cath 8/11 showed 50-60% proximal LAD stenosis, 40% mid LAD stenosis; 40-50% mid RCA in-stent restenosis, EF 60%.   Marland Kitchen HLD (hyperlipidemia)   . Depression   . Tobacco abuse   . HTN (hypertension)   .  Palpitations     proor holter monitor unremarkable per pt report   . Pulmonary embolism     hx of segmental Rt lower lobe acute pulmonary emboli w/no evidence of rt heart strain and overall small clot burden. in 2009. pt no lnger taking coumadin     Current Outpatient Prescriptions  Medication Sig Dispense Refill  . aspirin EC 81 MG tablet Take 1 tablet (81 mg total) by mouth daily.    Marland Kitchen atorvastatin (LIPITOR) 80 MG tablet Take 1 tablet (80 mg total) by mouth daily. 90 tablet 3  . citalopram (CELEXA) 20 MG  tablet Take 1 tablet by mouth daily.    . clopidogrel (PLAVIX) 75 MG tablet Take 1 tablet (75 mg total) by mouth daily. 90 tablet 3  . nitroGLYCERIN (NITROSTAT) 0.4 MG SL tablet Place 0.4 mg under the tongue every 5 (five) minutes as needed for chest pain.    . tamsulosin (FLOMAX) 0.4 MG CAPS capsule Take 0.4 mg by mouth daily.    . carvedilol (COREG) 6.25 MG tablet Take 1 tablet (6.25 mg total) by mouth 2 (two) times daily. 60 tablet 6  . ranolazine (RANEXA) 1000 MG SR tablet Take 1 tablet (1,000 mg total) by mouth 2 (two) times daily. 14 tablet 0   Current Facility-Administered Medications  Medication Dose Route Frequency Provider Last Rate Last Dose  . 0.9 %  sodium chloride infusion   Intravenous Continuous Laurey Morale, MD        Allergies:    Allergies  Allergen Reactions  . Penicillins Other (See Comments)    blisters    Social History:  The patient  reports that he has been smoking Cigarettes.  He has a 51 pack-year smoking history. He does not have any smokeless tobacco history on file. He reports that he drinks alcohol. He reports that he does not use illicit drugs.   Family history:   Family History  Problem Relation Age of Onset  . Coronary artery disease Father     family hx  . Cancer Paternal Grandmother   . Emphysema Mother     ROS:  Please see the history of present illness.  All other systems reviewed and negative.   PHYSICAL EXAM: VS:  BP 122/72 mmHg  Pulse 60  Ht  (1.803 m)  Wt 191 lb 11.2 oz (86.955 kg)  BMI 26.75 kg/m2 Well nourished, well developed, in no acute distress HEENT: Pupils are equal round react to light accommodation extraocular movements are intact.  Neck: no JVDNo cervical lymphadenopathy. Cardiac: Regular rate and rhythm without murmurs rubs or gallops. Lungs:  clear to auscultation bilaterally, no wheezing, rhonchi or rales Abd: soft, nontender, positive bowel sounds all quadrants, no hepatosplenomegaly Ext: no lower extremity  edema.  2+ radial and dorsalis pedis pulses. Skin: warm and dry Neuro:  Grossly normal  EKG:  Normal sinus rhythm rate 60 bpm     ASSESSMENT AND PLAN:  Problem List Items Addressed This Visit    Unstable angina - Primary   Relevant Medications   carvedilol (COREG) 6.25 MG tablet   ranolazine (RANEXA) 1000 MG SR tablet   Other Relevant Orders   EKG 12-Lead   LEFT HEART CATHETERIZATION WITH CORONARY ANGIOGRAM   APTT   Basic metabolic panel   CBC   Protime-INR   DG Chest 2 View   HLD (hyperlipidemia)   Relevant Medications   carvedilol (COREG) 6.25 MG tablet   ranolazine (RANEXA) 1000 MG SR tablet   CORONARY  ATHEROSCLEROSIS NATIVE CORONARY ARTERY   Relevant Medications   carvedilol (COREG) 6.25 MG tablet   ranolazine (RANEXA) 1000 MG SR tablet   Other Relevant Orders   EKG 12-Lead   LEFT HEART CATHETERIZATION WITH CORONARY ANGIOGRAM   APTT   Basic metabolic panel   CBC   Protime-INR   DG Chest 2 View   Benign hypertension   Relevant Medications   carvedilol (COREG) 6.25 MG tablet   ranolazine (RANEXA) 1000 MG SR tablet    Other Visit Diagnoses    Pre-op testing        Relevant Orders    EKG 12-Lead    LEFT HEART CATHETERIZATION WITH CORONARY ANGIOGRAM    APTT    Basic metabolic panel    CBC    Protime-INR    DG Chest 2 View    Clotting disorder        Relevant Orders    APTT    Protime-INR    Stable angina        Relevant Medications    carvedilol (COREG) 6.25 MG tablet    ranolazine (RANEXA) 1000 MG SR tablet       Unstable angina: This patient has been scheduled for left heart catheterization for next Wednesday. We have restarted his Coreg and Ranexa 1000 mg twice daily.  He is taking aspirin and Plavix.He was instructed to call 911 immediately if he has another severe episode of chest pain and not to take sublingual nitroglycerin once he is sitting down.  precath labs and chest x-ray will be taken care of. Was also instructed to take it easy with no  exertion until his catheterization. Hyperlipidemia  Continue statin Essential hypertension Blood pressure is well-controlled. I'm going to stop his lisinopril since we are restarting his Coreg to make sure we do not drop his blood pressure.

## 2015-04-14 NOTE — Patient Instructions (Signed)
Restart cavedilol 6.25 mg twice a day   stop taking lisinopril now  Start RANEXA 1000 MG TWICE A DAY,GET PRESCRIPTION WHEN DISCHARGE FROM HOSPITAL  LAB ON SATURDAY OR Tuesday OF THIS WEEK.   Your physician has requested that you have a cardiac catheterization. Cardiac catheterization is used to diagnose and/or treat various heart conditions. Doctors may recommend this procedure for a number of different reasons. The most common reason is to evaluate chest pain. Chest pain can be a symptom of coronary artery disease (CAD), and cardiac catheterization can show whether plaque is narrowing or blocking your heart's arteries. This procedure is also used to evaluate the valves, as well as measure the blood flow and oxygen levels in different parts of your heart. For further information please visit [email or web address] Please follow instruction sheet, as given.

## 2015-04-18 ENCOUNTER — Ambulatory Visit
Admission: RE | Admit: 2015-04-18 | Discharge: 2015-04-18 | Disposition: A | Discharge status: Home / Self Care | Payer: BLUE CROSS/BLUE SHIELD | Source: Ambulatory Visit | Attending: Physician Assistant | Admitting: Physician Assistant

## 2015-04-18 DIAGNOSIS — Z01818 Encounter for other preprocedural examination: Secondary | ICD-10-CM

## 2015-04-18 DIAGNOSIS — I25110 Atherosclerotic heart disease of native coronary artery with unstable angina pectoris: Secondary | ICD-10-CM

## 2015-04-18 DIAGNOSIS — I200 Unstable angina: Secondary | ICD-10-CM

## 2015-04-18 LAB — BASIC METABOLIC PANEL
BUN: 19 mg/dL (ref 7–25)
CO2: 25 mmol/L (ref 20–31)
CREATININE: 1.14 mg/dL (ref 0.70–1.33)
Calcium: 9.1 mg/dL (ref 8.6–10.3)
Chloride: 107 mmol/L (ref 98–110)
Glucose, Bld: 67 mg/dL (ref 65–99)
Potassium: 4.2 mmol/L (ref 3.5–5.3)
Sodium: 139 mmol/L (ref 135–146)

## 2015-04-18 LAB — CBC
HCT: 46.9 % (ref 39.0–52.0)
Hemoglobin: 15.9 g/dL (ref 13.0–17.0)
MCH: 32.3 pg (ref 26.0–34.0)
MCHC: 33.9 g/dL (ref 30.0–36.0)
MCV: 95.1 fL (ref 78.0–100.0)
MPV: 9.7 fL (ref 8.6–12.4)
PLATELETS: 252 10*3/uL (ref 150–400)
RBC: 4.93 MIL/uL (ref 4.22–5.81)
RDW: 13.7 % (ref 11.5–15.5)
WBC: 7.9 10*3/uL (ref 4.0–10.5)

## 2015-04-18 LAB — PROTIME-INR
INR: 0.94 (ref <1.50)
Prothrombin Time: 12.7 seconds (ref 11.6–15.2)

## 2015-04-18 LAB — APTT: aPTT: 27 seconds (ref 24–37)

## 2015-04-19 ENCOUNTER — Ambulatory Visit (HOSPITAL_COMMUNITY)
Admission: RE | Admit: 2015-04-19 | Discharge: 2015-04-19 | Disposition: A | Discharge status: Home / Self Care | Payer: BLUE CROSS/BLUE SHIELD | Source: Ambulatory Visit | Attending: Cardiovascular Disease | Admitting: Cardiovascular Disease

## 2015-04-19 ENCOUNTER — Encounter (HOSPITAL_COMMUNITY)
Admission: RE | Disposition: A | Discharge status: Home / Self Care | Payer: Self-pay | Source: Ambulatory Visit | Attending: Cardiovascular Disease

## 2015-04-19 DIAGNOSIS — I10 Essential (primary) hypertension: Secondary | ICD-10-CM | POA: Insufficient documentation

## 2015-04-19 DIAGNOSIS — Z86711 Personal history of pulmonary embolism: Secondary | ICD-10-CM | POA: Insufficient documentation

## 2015-04-19 DIAGNOSIS — F17210 Nicotine dependence, cigarettes, uncomplicated: Secondary | ICD-10-CM | POA: Insufficient documentation

## 2015-04-19 DIAGNOSIS — Z01818 Encounter for other preprocedural examination: Secondary | ICD-10-CM

## 2015-04-19 DIAGNOSIS — Z7902 Long term (current) use of antithrombotics/antiplatelets: Secondary | ICD-10-CM | POA: Insufficient documentation

## 2015-04-19 DIAGNOSIS — I25110 Atherosclerotic heart disease of native coronary artery with unstable angina pectoris: Secondary | ICD-10-CM | POA: Insufficient documentation

## 2015-04-19 DIAGNOSIS — Z955 Presence of coronary angioplasty implant and graft: Secondary | ICD-10-CM | POA: Diagnosis not present

## 2015-04-19 DIAGNOSIS — Z8249 Family history of ischemic heart disease and other diseases of the circulatory system: Secondary | ICD-10-CM | POA: Diagnosis not present

## 2015-04-19 DIAGNOSIS — I200 Unstable angina: Secondary | ICD-10-CM | POA: Diagnosis present

## 2015-04-19 DIAGNOSIS — Z7982 Long term (current) use of aspirin: Secondary | ICD-10-CM | POA: Insufficient documentation

## 2015-04-19 DIAGNOSIS — R51 Headache: Secondary | ICD-10-CM | POA: Diagnosis not present

## 2015-04-19 DIAGNOSIS — E785 Hyperlipidemia, unspecified: Secondary | ICD-10-CM | POA: Diagnosis not present

## 2015-04-19 DIAGNOSIS — F329 Major depressive disorder, single episode, unspecified: Secondary | ICD-10-CM | POA: Insufficient documentation

## 2015-04-19 DIAGNOSIS — Z880 Allergy status to penicillin: Secondary | ICD-10-CM | POA: Diagnosis not present

## 2015-04-19 HISTORY — PX: CARDIAC CATHETERIZATION: SHX172

## 2015-04-19 SURGERY — LEFT HEART CATH AND CORONARY ANGIOGRAPHY
Anesthesia: LOCAL

## 2015-04-19 MED ORDER — SODIUM CHLORIDE 0.9 % IJ SOLN: 3.0000 mL | Freq: Two times a day (BID) | INTRAMUSCULAR | Status: DC

## 2015-04-19 MED ORDER — HEPARIN SODIUM (PORCINE) 1000 UNIT/ML IJ SOLN
INTRAMUSCULAR | Status: AC
  Filled 2015-04-19: qty 1

## 2015-04-19 MED ORDER — SODIUM CHLORIDE 0.9 % IV SOLN
INTRAVENOUS | Status: DC
  Administered 2015-04-19: 12:00:00 via INTRAVENOUS

## 2015-04-19 MED ORDER — HEPARIN (PORCINE) IN NACL 2-0.9 UNIT/ML-% IJ SOLN
INTRAMUSCULAR | Status: AC
  Filled 2015-04-19: qty 1500

## 2015-04-19 MED ORDER — FENTANYL CITRATE (PF) 100 MCG/2ML IJ SOLN
INTRAMUSCULAR | Status: AC
  Filled 2015-04-19: qty 4

## 2015-04-19 MED ORDER — FENTANYL CITRATE (PF) 100 MCG/2ML IJ SOLN
INTRAMUSCULAR | Status: DC | PRN
  Administered 2015-04-19: 25 ug via INTRAVENOUS

## 2015-04-19 MED ORDER — ACETAMINOPHEN 325 MG PO TABS: 650.0000 mg | ORAL_TABLET | ORAL | Status: DC | PRN

## 2015-04-19 MED ORDER — SODIUM CHLORIDE 0.9 % IV SOLN: 250.0000 mL | INTRAVENOUS | Status: DC | PRN

## 2015-04-19 MED ORDER — LIDOCAINE HCL (PF) 1 % IJ SOLN
INTRAMUSCULAR | Status: DC | PRN
  Administered 2015-04-19: 14:00:00

## 2015-04-19 MED ORDER — ONDANSETRON HCL 4 MG/2ML IJ SOLN: 4.0000 mg | Freq: Four times a day (QID) | INTRAMUSCULAR | Status: DC | PRN

## 2015-04-19 MED ORDER — VERAPAMIL HCL 2.5 MG/ML IV SOLN
INTRAVENOUS | Status: DC | PRN
  Administered 2015-04-19: 14:00:00 via INTRA_ARTERIAL

## 2015-04-19 MED ORDER — HEPARIN SODIUM (PORCINE) 1000 UNIT/ML IJ SOLN
INTRAMUSCULAR | Status: DC | PRN
  Administered 2015-04-19: 4500 [IU] via INTRAVENOUS

## 2015-04-19 MED ORDER — LIDOCAINE HCL (PF) 1 % IJ SOLN
INTRAMUSCULAR | Status: AC
  Filled 2015-04-19: qty 30

## 2015-04-19 MED ORDER — MIDAZOLAM HCL 2 MG/2ML IJ SOLN
INTRAMUSCULAR | Status: DC | PRN
  Administered 2015-04-19: 1 mg via INTRAVENOUS

## 2015-04-19 MED ORDER — SODIUM CHLORIDE 0.9 % IJ SOLN: 3.0000 mL | INTRAMUSCULAR | Status: DC | PRN

## 2015-04-19 MED ORDER — VERAPAMIL HCL 2.5 MG/ML IV SOLN
INTRAVENOUS | Status: AC
  Filled 2015-04-19: qty 2

## 2015-04-19 MED ORDER — IOHEXOL 350 MG/ML SOLN
INTRAVENOUS | Status: DC | PRN
  Administered 2015-04-19: 75 mL via INTRA_ARTERIAL

## 2015-04-19 MED ORDER — SODIUM CHLORIDE 0.9 % WEIGHT BASED INFUSION: 3.0000 mL/kg/h | INTRAVENOUS | Status: DC

## 2015-04-19 MED ORDER — MIDAZOLAM HCL 2 MG/2ML IJ SOLN
INTRAMUSCULAR | Status: AC
  Filled 2015-04-19: qty 4

## 2015-04-19 SURGICAL SUPPLY — 11 items

## 2015-04-19 NOTE — H&P (View-Only) (Signed)
Patient ID: Jesse Page, male   DOB: 01-24-1961, 54 y.o.   MRN: [id number]    Date:  04/14/2015   ID:  Jesse Page, DOB 10/31/1960, MRN [id number]  PCP:  Carmin Richmond, MD  Primary Cardiologist:  Shirlee Latch   Chief Complaint  Patient presents with  . Follow-up    pt c/o chest pain 1-2/10      History of Present Illness: Jesse Page is a 54 y.o. male with h/o smoking and CAD s/p  PCI to the RCA. Patient was admitted to [facility] in 07/2009 with unstable angina-type symptoms. He was getting chest tightness and shortness of breath with only mild exertion (walking) for a couple of weeks. Left heart cath showed significant mid RCA stenosis and apical LAD stenosis. He underwent PCI with Xience DES to the mid RCA. In the summer of 2011, patient developed chest tightness that was both exertional and nonexertional. The pain was relieved by NTG. He was set up for an ETT-myoview that showed probable inferior attenuation but he developed severe chest pain on the treadmill and it was decided to cath him. Left heart cath (04/11/10) showed moderate, non-flow limiting disease in the LAD and mild in-stent restenosis in the RCA. No intervention.   Patient again developed chest pain episodes in the summer of 2012. Jesse Page was done in 7/12 and showed EF 54%, no ischemia or infarction. At last appointment, he reported worsening exertional dyspnea and chest pain. Dr. Shirlee Latch took him for [facility] on 04/27/2014. This showed 90% distal LAD stenosis (stable from past) and 70% ostial ramus stenosis. FFR was done on the ramus lesion, result was 0.86 (not significant). He was treated medically.   He was started on Imdur, which significantly improved his exertional chest pain. However, he could not tolerate it due to headache and stopped it.  Patient presents for evaluation of chest pain. He reports 3 weeks ago he had an episode of 8-9 out of 10 chest pain radiating to his neck bilaterally and associated with  extreme diaphoresis. He says he took one sublingual nitroglycerin and passed out. Likely he was not injured. He then took a second sublingual nitroglycerin after recovering and felt much better. He's had some off-and-on chest pain since that time which at the most, was 3 out of 10 in intensity. It does ease off with sublingual nitroglycerin.  He apparently is not keeping his wife very well posted about his symptoms.  He also reports that he has stopped taking his Coreg well over a month ago as well as the Ranexa. He is intolerant to Imdur stating it causes him a severe headache.    The patient currently denies nausea, vomiting, fever, chest pain, shortness of breath, orthopnea, dizziness, PND, cough, congestion, abdominal pain, hematochezia, melena, lower extremity edema, claudication.  Wt Readings from Last 3 Encounters:  04/14/15 191 lb 11.2 oz (86.955 kg)  05/19/14 197 lb 12.8 oz (89.721 kg)  05/04/14 200 lb (90.719 kg)     Past Medical History  Diagnosis Date  . CAD (coronary artery disease)     unstable angina 12/10. LHC showed 90% distal LAD stenosis and 90% mid RCA stenosis. EF 55%. pt had Xience DES to mid RCA. echo 2/11: EF 55-60%, nml wall motion, no sig valvular abnml. repeat cath 8/11 showed 50-60% proximal LAD stenosis, 40% mid LAD stenosis; 40-50% mid RCA in-stent restenosis, EF 60%.   Jesse Page HLD (hyperlipidemia)   . Depression   . Tobacco abuse   . HTN (hypertension)   .  Palpitations     proor holter monitor unremarkable per pt report   . Pulmonary embolism     hx of segmental Rt lower lobe acute pulmonary emboli w/no evidence of rt heart strain and overall small clot burden. in 2009. pt no lnger taking coumadin     Current Outpatient Prescriptions  Medication Sig Dispense Refill  . aspirin EC 81 MG tablet Take 1 tablet (81 mg total) by mouth daily.    Jesse Page atorvastatin (LIPITOR) 80 MG tablet Take 1 tablet (80 mg total) by mouth daily. 90 tablet 3  . citalopram (CELEXA) 20 MG  tablet Take 1 tablet by mouth daily.    . clopidogrel (PLAVIX) 75 MG tablet Take 1 tablet (75 mg total) by mouth daily. 90 tablet 3  . nitroGLYCERIN (NITROSTAT) 0.4 MG SL tablet Place 0.4 mg under the tongue every 5 (five) minutes as needed for chest pain.    . tamsulosin (FLOMAX) 0.4 MG CAPS capsule Take 0.4 mg by mouth daily.    . carvedilol (COREG) 6.25 MG tablet Take 1 tablet (6.25 mg total) by mouth 2 (two) times daily. 60 tablet 6  . ranolazine (RANEXA) 1000 MG SR tablet Take 1 tablet (1,000 mg total) by mouth 2 (two) times daily. 14 tablet 0   Current Facility-Administered Medications  Medication Dose Route Frequency Provider Last Rate Last Dose  . 0.9 %  sodium chloride infusion   Intravenous Continuous Laurey Morale, MD        Allergies:    Allergies  Allergen Reactions  . Penicillins Other (See Comments)    blisters    Social History:  The patient  reports that he has been smoking Cigarettes.  He has a 51 pack-year smoking history. He does not have any smokeless tobacco history on file. He reports that he drinks alcohol. He reports that he does not use illicit drugs.   Family history:   Family History  Problem Relation Age of Onset  . Coronary artery disease Father     family hx  . Cancer Paternal Grandmother   . Emphysema Mother     ROS:  Please see the history of present illness.  All other systems reviewed and negative.   PHYSICAL EXAM: VS:  BP 122/72 mmHg  Pulse 60  Ht  (1.803 m)  Wt 191 lb 11.2 oz (86.955 kg)  BMI 26.75 kg/m2 Well nourished, well developed, in no acute distress HEENT: Pupils are equal round react to light accommodation extraocular movements are intact.  Neck: no JVDNo cervical lymphadenopathy. Cardiac: Regular rate and rhythm without murmurs rubs or gallops. Lungs:  clear to auscultation bilaterally, no wheezing, rhonchi or rales Abd: soft, nontender, positive bowel sounds all quadrants, no hepatosplenomegaly Ext: no lower extremity  edema.  2+ radial and dorsalis pedis pulses. Skin: warm and dry Neuro:  Grossly normal  EKG:  Normal sinus rhythm rate 60 bpm     ASSESSMENT AND PLAN:  Problem List Items Addressed This Visit    Unstable angina - Primary   Relevant Medications   carvedilol (COREG) 6.25 MG tablet   ranolazine (RANEXA) 1000 MG SR tablet   Other Relevant Orders   EKG 12-Lead   LEFT HEART CATHETERIZATION WITH CORONARY ANGIOGRAM   APTT   Basic metabolic panel   CBC   Protime-INR   DG Chest 2 View   HLD (hyperlipidemia)   Relevant Medications   carvedilol (COREG) 6.25 MG tablet   ranolazine (RANEXA) 1000 MG SR tablet   CORONARY  ATHEROSCLEROSIS NATIVE CORONARY ARTERY   Relevant Medications   carvedilol (COREG) 6.25 MG tablet   ranolazine (RANEXA) 1000 MG SR tablet   Other Relevant Orders   EKG 12-Lead   LEFT HEART CATHETERIZATION WITH CORONARY ANGIOGRAM   APTT   Basic metabolic panel   CBC   Protime-INR   DG Chest 2 View   Benign hypertension   Relevant Medications   carvedilol (COREG) 6.25 MG tablet   ranolazine (RANEXA) 1000 MG SR tablet    Other Visit Diagnoses    Pre-op testing        Relevant Orders    EKG 12-Lead    LEFT HEART CATHETERIZATION WITH CORONARY ANGIOGRAM    APTT    Basic metabolic panel    CBC    Protime-INR    DG Chest 2 View    Clotting disorder        Relevant Orders    APTT    Protime-INR    Stable angina        Relevant Medications    carvedilol (COREG) 6.25 MG tablet    ranolazine (RANEXA) 1000 MG SR tablet       Unstable angina: This patient has been scheduled for left heart catheterization for next Wednesday. We have restarted his Coreg and Ranexa 1000 mg twice daily.  He is taking aspirin and Plavix.He was instructed to call 911 immediately if he has another severe episode of chest pain and not to take sublingual nitroglycerin once he is sitting down.  precath labs and chest x-ray will be taken care of. Was also instructed to take it easy with no  exertion until his catheterization. Hyperlipidemia  Continue statin Essential hypertension Blood pressure is well-controlled. I'm going to stop his lisinopril since we are restarting his Coreg to make sure we do not drop his blood pressure.

## 2015-04-19 NOTE — Progress Notes (Signed)
Patient Information: 1. Suspected or Known ACS 2. UA/NSTEMI 3. Intermediate ACS Risk Score (e.g.,TIMI, GRACE) A (8) Indication: 3;  Jesse Page 04/19/2015

## 2015-04-19 NOTE — Interval H&P Note (Signed)
Cath Lab Visit (complete for each Cath Lab visit)  Clinical Evaluation Leading to the Procedure:   ACS: No.  Non-ACS:    Anginal Classification: CCS III  Anti-ischemic medical therapy: Maximal Therapy (2 or more classes of medications)  Non-Invasive Test Results: No non-invasive testing performed  Prior CABG: No previous CABG      History and Physical Interval Note:  04/19/2015 1:33 PM  Jesse Page  has presented today for surgery, with the diagnosis of unstable angina  The various methods of treatment have been discussed with the patient and family. After consideration of risks, benefits and other options for treatment, the patient has consented to  Procedure(s): Left Heart Cath and Coronary Angiography (N/A) as a surgical intervention .  The patient's history has been reviewed, patient examined, no change in status, stable for surgery.  I have reviewed the patient's chart and labs.  Questions were answered to the patient's satisfaction.     Jesse Page [employer]

## 2015-04-19 NOTE — Discharge Instructions (Signed)
Radial Site Care °Refer to this sheet in the next few weeks. These instructions provide you with information on caring for yourself after your procedure. Your caregiver may also give you more specific instructions. Your treatment has been planned according to current medical practices, but problems sometimes occur. Call your caregiver if you have any problems or questions after your procedure. °HOME CARE INSTRUCTIONS °· You may shower the day after the procedure. Remove the bandage (dressing) and gently wash the site with plain soap and water. Gently pat the site dry. °· Do not apply powder or lotion to the site. °· Do not submerge the affected site in water for 3 to 5 days. °· Inspect the site at least twice daily. °· Do not flex or bend the affected arm for 24 hours. °· No lifting over 5 pounds (2.3 kg) for 5 days after your procedure. °· Do not drive home if you are discharged the same day of the procedure. Have someone else drive you. °· You may drive 24 hours after the procedure unless otherwise instructed by your caregiver. °· Do not operate machinery or power tools for 24 hours. °· A responsible adult should be with you for the first 24 hours after you arrive home. °What to expect: °· Any bruising will usually fade within 1 to 2 weeks. °· Blood that collects in the tissue (hematoma) may be painful to the touch. It should usually decrease in size and tenderness within 1 to 2 weeks. °SEEK IMMEDIATE MEDICAL CARE IF: °· You have unusual pain at the radial site. °· You have redness, warmth, swelling, or pain at the radial site. °· You have drainage (other than a small amount of blood on the dressing). °· You have chills. °· You have a fever or persistent symptoms for more than 72 hours. °· You have a fever and your symptoms suddenly get worse. °· Your arm becomes pale, cool, tingly, or numb. °· You have heavy bleeding from the site. Hold pressure on the site. °Document Released: 08/31/2010 Document Revised:  10/21/2011 Document Reviewed: 08/31/2010 °ExitCare® Patient Information ©2015 [employer]. This information is not intended to replace advice given to you by your health care provider. Make sure you discuss any questions you have with your health care provider. ° °

## 2015-04-19 NOTE — Progress Notes (Signed)
Client states has 1/10 chest pain and it is same pain has had for one month and does not take ntg for it; client took aspirin at home

## 2015-04-20 ENCOUNTER — Encounter (HOSPITAL_COMMUNITY): Payer: Self-pay | Admitting: Cardiology

## 2015-04-24 ENCOUNTER — Other Ambulatory Visit: Payer: Self-pay

## 2015-05-02 ENCOUNTER — Other Ambulatory Visit: Payer: Self-pay | Admitting: *Deleted

## 2015-05-02 ENCOUNTER — Telehealth: Payer: Self-pay | Admitting: *Deleted

## 2015-05-02 MED ORDER — CARVEDILOL 6.25 MG PO TABS: 6.2500 mg | ORAL_TABLET | Freq: Two times a day (BID) | ORAL | Status: DC

## 2015-05-02 MED ORDER — RANOLAZINE ER 1000 MG PO TB12: 1000.0000 mg | ORAL_TABLET | Freq: Two times a day (BID) | ORAL | Status: DC

## 2015-05-02 NOTE — Telephone Encounter (Signed)
Post cath instructions 04/19/15 by Dr Shirlee Latch indicate amlodipine dose is 2.5mg  daily. Ms Sheets at [facility] in [facility] advised, states she will call patient.

## 2015-05-02 NOTE — Telephone Encounter (Signed)
[facility] called and stated that the patient presented a hand written rx for amlodipine 2.5mg  with a sig of take one dose bid with a quantity of #30. I do not see this on patients medication list. Please advise. Thanks, MI

## 2015-05-21 ENCOUNTER — Other Ambulatory Visit: Payer: Self-pay | Admitting: Cardiology

## 2015-08-23 ENCOUNTER — Other Ambulatory Visit: Payer: Self-pay | Admitting: Cardiology

## 2016-03-17 DIAGNOSIS — I779 Disorder of arteries and arterioles, unspecified: Secondary | ICD-10-CM | POA: Insufficient documentation

## 2016-03-17 DIAGNOSIS — I739 Peripheral vascular disease, unspecified: Secondary | ICD-10-CM

## 2016-03-17 NOTE — Progress Notes (Signed)
Cardiology Office Note:    Date:  03/18/2016   ID:  Jesse Page, DOB [birth year], MRN [id number]  PCP:  Carmin Richmond, MD  Cardiologist:  Dr. [name]   Electrophysiologist:  n/a  Referring MD: Carmin Richmond, MD   Chief Complaint  Patient presents with  . Follow-up    CAD  . Chest Pain   History of Present Illness:    Jesse Page is a 55 y.o. male with a hx of tobacco abuse, CAD.  He was admitted in 12/10 with [place]. LHC showed significant mid RCA stenosis and apical LAD stenosis. He underwent PCI with Xience DES to the mid RCA.  LHC in 8/11 (recurrent CP and + CP on treadmill for Nuc study) showed moderate, non-flow limiting disease in the LAD and mild in-stent restenosis in the RCA. No intervention.  He developed recurrent chest pain episodes in the summer of 2012.  Lexiscan myoview in 7/12 showed EF 54%, no ischemia or infarction.  He had recurrent symptoms and underwent LHC in 9/15 showed 90% distal LAD stenosis (stable from past) and 70% ostial ramus stenosis.  FFR was done on the ramus lesion, result was 0.86 (not significant).  He was treated medically.  He had response to Isosorbide but could not tolerate this due to severe HA.  Last seen by Dr. [name] in 10/15.  Last seen by Wilburt Finlay, PA-C in 9/16. He was set up for a LHC 2/2 chest pain. LHC demonstrated patent RCA stent. He did have distal LAD stenosis up to 80%. The vessel was small in caliber at this point. It was thought that this could be the source of his chest discomfort. Overall, anatomy was stable compared to previous cardiac catheterization. He was treated medically. Ranexa was added.    Returns for FU.  He is here today with his wife. Over the past several weeks, he has noticed increasing exertional angina. This typically happens with mowing his yard with a push mower. It usually happens when he is mowing in extreme temperatures. It usually comes on after about 20 minutes. Rest improves symptoms. He  typically has prompt relief with sublingual nitroglycerin x 1. He has associated shortness of breath. Denies associated radiation, nausea. Denies syncope. Denies orthopnea, PND or edema. He does note increasing palpitations. This typically occurs after eating a meal. He denies rapid heart beats. He continues to smoke. He is trying to get approved for nicotine patches thru his insurance.  Prior CV studies that were reviewed today include:    LHC 9/16 Left Main No significant disease.  LAD - Ectatic prox to mid, 30% prox, 30% mid, diffuse up to 80% distal LAD (small in caliber)  RI - Moderate vessel with 50-60% ostial - similar to better than on prior cath.  LCx - Small, luminal irregularities.  RCA - mid RCA stent with 30% ISR in the proximal portion of the stent. 30% distal EF 60%, normal wall motion.  CONCLUSION: Stable appearing coronaries.  The ostial ramus stenosis looks similar to improved compared to the prior cath where we did FFR and stenosis was not hemodynamically significant.  Most likely source of chest pain is the diffusely diseased distal LAD (up to 80%).  The vessel is small caliber at this point and not a good interventional target.  Symptoms began after the patient stopped his Ranexa and Coreg.  He restarted on Friday and has felt better since.  Also possible coronary vasospasm related to his smoking.  PLAN: I want  him to continue the ranolazine and Coreg.  He will start on amlodipine 2.5 mg daily for vasospasm and angina.  He was strongly urged to quit smoking.  He has had a headache with Imdur in the past.  Will have him followup in 2 wks.   Carotid 10/15 Heterogeneous plaque, bilaterally. 40-59% RICA stenosis. 1-39% LICA stenosis. Normal subclavian arteries, bilaterally. Patent vertebral arteries with antegrade flow. f/u 1 year  Myoview 7/12  Overall Impression:  Normal stress nuclear study.  No evidence of ischemia.  Normal LV function.  Past Medical History:    Diagnosis Date  . CAD (coronary artery disease)    a. [place] 12/10 >> s/p Xience DES to mRCA.// b. LHC 8/11 - patent RCA stent, stable anatomy  //  c. LHC 9/15 -  90% distal LAD stenosis (stable from past) and 70% ostial ramus stenosis.  FFR was done on the ramus lesion, result was 0.86 (not significant). - med Rx. //  d. LHC 9/16 - pLAD 30, mLAD 30, dLAD diff 80 (small), oRI 50-60 (unchanged), mRCA stent ok with 30% ISR, dRCA 30, EF 60% - med Rx  . Carotid artery disease (HCC)    A. Carotid US 10/15: R 40-59%, L 1-39% >> FU 1 year  . Depression   . History of echocardiogram    a. Echo (2/11): EF 55-60%, normal wall motion, no significant valvular abnormalities.  . History of nuclear stress test    a. Myoview 7/12 - Overall Impression:  Normal stress nuclear study.  No evidence of ischemia.  Normal LV function.  Marland Kitchen History of pulmonary embolism 2009   hx of segmental Rt lower lobe acute pulmonary emboli w/no evidence of rt heart strain and overall small clot burden. in 2009. pt no lnger taking coumadin   . HLD (hyperlipidemia)   . HTN (hypertension)   . Palpitations    PVCs noted in past  . Tobacco abuse    quit 12/11 but restarted.  PFTs in 8/13 were normal.  He was unable to tolerate Chantix or wellbutrin.   Sleep study (9/15) with no significant OSA.   Past Surgical History:  Procedure Laterality Date  . angioplasty    . CARDIAC CATHETERIZATION N/A 04/19/2015   Procedure: Left Heart Cath and Coronary Angiography;  Surgeon: Laurey Morale, MD;  Location: [facility] INVASIVE CV LAB;  Service: Cardiovascular;  Laterality: N/A;  . HIP SURGERY    . LEFT HEART CATHETERIZATION WITH CORONARY ANGIOGRAM N/A 04/27/2014   Procedure: LEFT HEART CATHETERIZATION WITH CORONARY ANGIOGRAM;  Surgeon: Laurey Morale, MD;  Location: [facility] CATH LAB;  Service: Cardiovascular;  Laterality: N/A;    Current Medications: Current Meds  Medication Sig  . amLODipine (NORVASC) 2.5 MG tablet Take 1 tablet (2.5 mg total) by  mouth 2 (two) times daily.  Marland Kitchen aspirin EC 81 MG tablet Take 1 tablet (81 mg total) by mouth daily.  Marland Kitchen atorvastatin (LIPITOR) 80 MG tablet TAKE 1 TABLET (80 MG TOTAL) BY MOUTH DAILY.  . carvedilol (COREG) 6.25 MG tablet Take 1 tablet (6.25 mg total) by mouth 2 (two) times daily.  . citalopram (CELEXA) 20 MG tablet Take 1 tablet by mouth daily.  . clopidogrel (PLAVIX) 75 MG tablet TAKE 1 TABLET (75 MG TOTAL) BY MOUTH DAILY.  . nitroGLYCERIN (NITROSTAT) 0.4 MG SL tablet Place 0.4 mg under the tongue every 5 (five) minutes as needed for chest pain.  . ranolazine (RANEXA) 1000 MG SR tablet Take 1 tablet (1,000 mg total) by mouth  2 (two) times daily.  . Tamsulosin HCl (FLOMAX PO) Take 1 capsule by mouth daily.  . [DISCONTINUED] amLODipine (NORVASC) 2.5 MG tablet Take 1 tablet (2.5 mg total) by mouth daily.   Current Facility-Administered Medications for the 03/18/16 encounter (Office Visit) with Beatrice Lecher, PA-C  Medication  . 0.9 %  sodium chloride infusion       Allergies:   Penicillins   Social History   Social History  . Marital status: Married    Spouse name: N/A  . Number of children: N/A  . Years of education: N/A   Occupational History  . paramedic    Social History Main Topics  . Smoking status: Current Every Day Smoker    Packs/day: 1.50    Years: 34.00    Types: Cigarettes  . Smokeless tobacco: None     Comment: smokes 1 ppd; quit 12/11   . Alcohol use Yes     Comment: occasional   . Drug use: No  . Sexual activity: Not Asked   Other Topics Concern  . None   Social History Narrative   Married, lives in [place]. Paramedic in [employer]      Family History:  The patient's family history includes Cancer in his paternal grandmother; Coronary artery disease in his father; Emphysema in his mother.   ROS:   Please see the history of present illness.    Review of Systems  Eyes: Positive for visual disturbance.  Cardiovascular: Positive for chest pain,  dyspnea on exertion and irregular heartbeat.  Genitourinary: Positive for incomplete emptying.   All other systems reviewed and are negative.   EKGs/Labs/Other Test Reviewed:    EKG:  EKG is  ordered today.  The ekg ordered today demonstrates Sinus bradycardia, HR 57, normal axis, QTC 422 ms, septal Q waves, no change from prior tracing  Recent Labs: 04/18/2015: BUN 19; Creat 1.14; Hemoglobin 15.9; Platelets 252; Potassium 4.2; Sodium 139   Recent Lipid Panel    Component Value Date/Time   CHOL 145 02/25/2012 1258   TRIG 134.0 02/25/2012 1258   HDL 44.90 02/25/2012 1258   CHOLHDL 3 02/25/2012 1258   VLDL 26.8 02/25/2012 1258   LDLCALC 73 02/25/2012 1258     Physical Exam:    VS:  BP 120/80   Pulse (!) 58   Ht 5\' 11"  (1.803 m)   Wt 191 lb (86.6 kg)   BMI 26.64 kg/m     Wt Readings from Last 3 Encounters:  03/18/16 191 lb (86.6 kg)  04/19/15 191 lb (86.6 kg)  04/14/15 191 lb 11.2 oz (87 kg)     Physical Exam  Constitutional: He is oriented to person, place, and time. He appears well-developed and well-nourished. No distress.  HENT:  Head: Normocephalic and atraumatic.  Eyes: No scleral icterus.  Neck: Normal range of motion. No JVD present. Carotid bruit is not present.  Cardiovascular: Normal rate, regular rhythm, S1 normal and S2 normal.  Exam reveals no gallop, no distant heart sounds and no friction rub.   No murmur heard. Pulses:      Dorsalis pedis pulses are 2+ on the right side, and 2+ on the left side.  Pulmonary/Chest: Effort normal. He has decreased breath sounds. He has no wheezes. He has no rhonchi. He has no rales.  Abdominal: Soft. He exhibits no distension and no mass. There is no tenderness.  Musculoskeletal: Normal range of motion. He exhibits no edema.  Lymphadenopathy:    He has no  cervical adenopathy.  Neurological: He is alert and oriented to person, place, and time.  Skin: Skin is warm and dry.  Psychiatric: He has a normal mood and affect.      ASSESSMENT:    1. Coronary artery disease involving native coronary artery of native heart with angina pectoris (HCC)   2. HLD (hyperlipidemia)   3. Bilateral carotid artery disease (HCC)   4. Palpitations   5. Smoking    PLAN:    In order of problems listed above:  1. CAD - He is status post DES to the RCA in 2010. LHC in 2015 with 70% ostial RI. This was not hemodynamically significant by FFR. Last LHC in 9/16 with stable anatomy. He had diffuse distal small vessel disease in the LAD. Medical therapy has been continued.  Today, he notes increasing exertional angina. He essentially describes CCS class II symptoms. His ECG is unchanged. He is only taking Ranexa once daily. Of note, he continues to smoke.  -  Continue aspirin, statin, beta blocker, Plavix  -  Increase amlodipine to 2.5 mg twice a day  -  Increase Ranexa to twice a day dosing as prescribed  -  Arrange exercise Myoview to assess for significant ischemia  -  Follow-up in 2 weeks  2. HL - Continue high-dose statin. Arrange CMET, lipids.  3. Carotid artery disease - No FU dopplers since 2015.  Will arrange Carotid US.     4. Palpitations - Etiology unclear. Arrange 48-hour Holter as these symptoms do occur on an almost daily basis.   5. Smoking - We discussed the importance of cessation. He is currently trying to quit.  Medication Adjustments/Labs and Tests Ordered: Current medicines are reviewed at length with the patient today.  Concerns regarding medicines are outlined above.  Medication changes, Labs and Tests ordered today are outlined in the Patient Instructions noted below. Patient Instructions  Medication Instructions: Your physician has recommended you make the following change in your medication:  1. Make sure to take Ranexa twice daily as prescribed 2. Increase Amlodipine to 2.5 mg twice daily. New RX sent to pharmacy Labwork: TODAY: CMET and Lipid Procedures/Testing: Your physician has requested that  you have a carotid duplex. This test is an ultrasound of the carotid arteries in your neck. It looks at blood flow through these arteries that supply the brain with blood. Allow one hour for this exam. There are no restrictions or special instructions. Your physician has requested that you have en exercise stress myoview. For further information please visit [email or web address]. Please follow instruction sheet, as given. Your physician has recommended that you wear a 48 hour holter monitor. Holter monitors are medical devices that record the heart's electrical activity. Doctors most often use these monitors to diagnose arrhythmias. Arrhythmias are problems with the speed or rhythm of the heartbeat. The monitor is a small, portable device. You can wear one while you do your normal daily activities. This is usually used to diagnose what is causing palpitations/syncope (passing out). Follow-Up: Your physician recommends that you schedule a follow-up appointment in: 2 weeks with Tereso NewcomerScott Jeriann Sayres, PA if possible same day Dr. Shirlee LatchMclean is in the office. Any Additional Special Instructions Will Be Listed Below (If Applicable). If you need a refill on your cardiac medications before your next appointment, please call your pharmacy.   Signed, Tereso NewcomerScott Eleanora Guinyard, PA-C  03/18/2016 11:03 AM    [facility] [address], [place], [place]  [address] Phone: [phone]; Fax: [phone]    

## 2016-03-18 ENCOUNTER — Encounter: Payer: Self-pay | Admitting: Physician Assistant

## 2016-03-18 ENCOUNTER — Encounter (INDEPENDENT_AMBULATORY_CARE_PROVIDER_SITE_OTHER): Payer: Self-pay

## 2016-03-18 ENCOUNTER — Ambulatory Visit (INDEPENDENT_AMBULATORY_CARE_PROVIDER_SITE_OTHER): Payer: BLUE CROSS/BLUE SHIELD | Admitting: Physician Assistant

## 2016-03-18 VITALS — BP 120/80 | HR 58 | Ht 71.0 in | Wt 191.0 lb

## 2016-03-18 DIAGNOSIS — R002 Palpitations: Secondary | ICD-10-CM

## 2016-03-18 DIAGNOSIS — I779 Disorder of arteries and arterioles, unspecified: Secondary | ICD-10-CM | POA: Diagnosis not present

## 2016-03-18 DIAGNOSIS — I25119 Atherosclerotic heart disease of native coronary artery with unspecified angina pectoris: Secondary | ICD-10-CM | POA: Diagnosis not present

## 2016-03-18 DIAGNOSIS — F17200 Nicotine dependence, unspecified, uncomplicated: Secondary | ICD-10-CM

## 2016-03-18 DIAGNOSIS — E785 Hyperlipidemia, unspecified: Secondary | ICD-10-CM | POA: Diagnosis not present

## 2016-03-18 DIAGNOSIS — Z720 Tobacco use: Secondary | ICD-10-CM

## 2016-03-18 DIAGNOSIS — I739 Peripheral vascular disease, unspecified: Secondary | ICD-10-CM

## 2016-03-18 LAB — COMPREHENSIVE METABOLIC PANEL
ALBUMIN: 4.1 g/dL (ref 3.6–5.1)
ALT: 26 U/L (ref 9–46)
AST: 23 U/L (ref 10–35)
Alkaline Phosphatase: 80 U/L (ref 40–115)
BILIRUBIN TOTAL: 0.9 mg/dL (ref 0.2–1.2)
BUN: 14 mg/dL (ref 7–25)
CALCIUM: 9.1 mg/dL (ref 8.6–10.3)
CHLORIDE: 104 mmol/L (ref 98–110)
CO2: 27 mmol/L (ref 20–31)
Creat: 0.98 mg/dL (ref 0.70–1.33)
Glucose, Bld: 82 mg/dL (ref 65–99)
Potassium: 4.3 mmol/L (ref 3.5–5.3)
Sodium: 140 mmol/L (ref 135–146)
TOTAL PROTEIN: 6.3 g/dL (ref 6.1–8.1)

## 2016-03-18 LAB — LIPID PANEL
CHOL/HDL RATIO: 3 ratio (ref <=5.0)
CHOLESTEROL: 116 mg/dL — AB (ref 125–200)
HDL: 39 mg/dL — AB (ref >=40)
LDL Cholesterol: 56 mg/dL (ref <130)
Triglycerides: 106 mg/dL (ref <150)
VLDL: 21 mg/dL (ref <30)

## 2016-03-18 MED ORDER — AMLODIPINE BESYLATE 2.5 MG PO TABS: 2.5000 mg | ORAL_TABLET | Freq: Two times a day (BID) | ORAL | 3 refills | Status: DC

## 2016-03-18 NOTE — Patient Instructions (Addendum)
Medication Instructions: Your physician has recommended you make the following change in your medication:  1. Make sure to take Ranexa twice daily as prescribed 2. Increase Amlodipine to 2.5 mg twice daily. New RX sent to pharmacy Labwork: TODAY: CMET and Lipid Procedures/Testing: Your physician has requested that you have a carotid duplex. This test is an ultrasound of the carotid arteries in your neck. It looks at blood flow through these arteries that supply the brain with blood. Allow one hour for this exam. There are no restrictions or special instructions. Your physician has requested that you have en exercise stress myoview. For further information please visit [email or web address]. Please follow instruction sheet, as given. Your physician has recommended that you wear a 48 hour holter monitor. Holter monitors are medical devices that record the heart's electrical activity. Doctors most often use these monitors to diagnose arrhythmias. Arrhythmias are problems with the speed or rhythm of the heartbeat. The monitor is a small, portable device. You can wear one while you do your normal daily activities. This is usually used to diagnose what is causing palpitations/syncope (passing out). Follow-Up: Your physician recommends that you schedule a follow-up appointment in: 2 weeks with Tereso NewcomerScott Weaver, PA if possible same day Dr. Shirlee LatchMclean is in the office. Any Additional Special Instructions Will Be Listed Below (If Applicable). If you need a refill on your cardiac medications before your next appointment, please call your pharmacy.

## 2016-03-19 ENCOUNTER — Telehealth (HOSPITAL_COMMUNITY): Payer: Self-pay | Admitting: *Deleted

## 2016-03-19 ENCOUNTER — Telehealth: Payer: Self-pay | Admitting: *Deleted

## 2016-03-19 NOTE — Telephone Encounter (Signed)
Patient given detailed instructions per Myocardial Perfusion Study Information Sheet for the test on 03/22/16 at 0745. Patient notified to arrive 15 minutes early and that it is imperative to arrive on time for appointment to keep from having the test rescheduled.  If you need to cancel or reschedule your appointment, please call the office within 24 hours of your appointment. Failure to do so may result in a cancellation of your appointment, and a $50 no show fee. Patient verbalized understanding.[name], Adelene IdlerCynthia W

## 2016-03-19 NOTE — Telephone Encounter (Signed)
Pt notified of lab results by phone with verbal understanding.  

## 2016-03-21 ENCOUNTER — Encounter: Payer: Self-pay | Admitting: Physician Assistant

## 2016-03-22 ENCOUNTER — Encounter: Payer: Self-pay | Admitting: Physician Assistant

## 2016-03-22 ENCOUNTER — Ambulatory Visit (INDEPENDENT_AMBULATORY_CARE_PROVIDER_SITE_OTHER): Payer: BLUE CROSS/BLUE SHIELD

## 2016-03-22 ENCOUNTER — Telehealth: Payer: Self-pay | Admitting: *Deleted

## 2016-03-22 ENCOUNTER — Ambulatory Visit (HOSPITAL_COMMUNITY): Payer: BLUE CROSS/BLUE SHIELD | Attending: Cardiology

## 2016-03-22 DIAGNOSIS — I25119 Atherosclerotic heart disease of native coronary artery with unspecified angina pectoris: Secondary | ICD-10-CM | POA: Diagnosis present

## 2016-03-22 DIAGNOSIS — R9439 Abnormal result of other cardiovascular function study: Secondary | ICD-10-CM | POA: Insufficient documentation

## 2016-03-22 DIAGNOSIS — R0609 Other forms of dyspnea: Secondary | ICD-10-CM | POA: Diagnosis not present

## 2016-03-22 DIAGNOSIS — R002 Palpitations: Secondary | ICD-10-CM | POA: Diagnosis not present

## 2016-03-22 DIAGNOSIS — I119 Hypertensive heart disease without heart failure: Secondary | ICD-10-CM | POA: Diagnosis not present

## 2016-03-22 LAB — MYOCARDIAL PERFUSION IMAGING
CHL CUP NUCLEAR SDS: 0
CHL CUP NUCLEAR SSS: 0
CSEPPHR: 95 {beats}/min
LHR: 0.25
LVDIAVOL: 147 mL (ref 62–150)
LVSYSVOL: 80 mL
Rest HR: 54 {beats}/min
SRS: 0
TID: 1.06

## 2016-03-22 MED ORDER — TECHNETIUM TC 99M TETROFOSMIN IV KIT
10.2000 | PACK | Freq: Once | INTRAVENOUS | Status: AC | PRN
  Administered 2016-03-22: 10 via INTRAVENOUS
  Filled 2016-03-22: qty 10

## 2016-03-22 MED ORDER — TECHNETIUM TC 99M TETROFOSMIN IV KIT
32.4000 | PACK | Freq: Once | INTRAVENOUS | Status: AC | PRN
  Administered 2016-03-22: 32 via INTRAVENOUS
  Filled 2016-03-22: qty 32

## 2016-03-22 MED ORDER — REGADENOSON 0.4 MG/5ML IV SOLN
0.4000 mg | Freq: Once | INTRAVENOUS | Status: AC
Start: 2016-03-22 — End: 2016-03-22
  Administered 2016-03-22: 0.4 mg via INTRAVENOUS

## 2016-03-22 NOTE — Telephone Encounter (Signed)
Lmtcb to go over myoview results. 

## 2016-03-22 NOTE — Telephone Encounter (Signed)
Pt notified of myoview results by phone with verbal understanding. Pt aware will cb once Dr. Shirlee LatchMcLean reviews myoview as well. Pt seeing PA 8/21, carotids 8/14

## 2016-03-25 ENCOUNTER — Ambulatory Visit (HOSPITAL_COMMUNITY)
Admission: RE | Admit: 2016-03-25 | Discharge: 2016-03-25 | Disposition: A | Discharge status: Home / Self Care | Payer: BLUE CROSS/BLUE SHIELD | Source: Ambulatory Visit | Attending: Physician Assistant | Admitting: Physician Assistant

## 2016-03-25 ENCOUNTER — Telehealth: Payer: Self-pay | Admitting: *Deleted

## 2016-03-25 DIAGNOSIS — Z720 Tobacco use: Secondary | ICD-10-CM | POA: Insufficient documentation

## 2016-03-25 DIAGNOSIS — I6522 Occlusion and stenosis of left carotid artery: Secondary | ICD-10-CM | POA: Insufficient documentation

## 2016-03-25 DIAGNOSIS — F329 Major depressive disorder, single episode, unspecified: Secondary | ICD-10-CM | POA: Insufficient documentation

## 2016-03-25 DIAGNOSIS — I10 Essential (primary) hypertension: Secondary | ICD-10-CM | POA: Diagnosis not present

## 2016-03-25 DIAGNOSIS — I2510 Atherosclerotic heart disease of native coronary artery without angina pectoris: Secondary | ICD-10-CM | POA: Insufficient documentation

## 2016-03-25 DIAGNOSIS — I779 Disorder of arteries and arterioles, unspecified: Secondary | ICD-10-CM | POA: Insufficient documentation

## 2016-03-25 DIAGNOSIS — E785 Hyperlipidemia, unspecified: Secondary | ICD-10-CM | POA: Diagnosis not present

## 2016-03-25 DIAGNOSIS — I739 Peripheral vascular disease, unspecified: Secondary | ICD-10-CM

## 2016-03-25 NOTE — Telephone Encounter (Signed)
Pt advised Dr. Shirlee LatchMcLean reviewed stress test as well. Pt notified per Dr. Shirlee LatchMcLean recommendations is for pt to keep her 8/21 appt w/PA, if symtpoms still persisting/worse will discuss doing cath. Pt agreeable to plan of care.

## 2016-03-26 ENCOUNTER — Telehealth: Payer: Self-pay | Admitting: *Deleted

## 2016-03-26 NOTE — Telephone Encounter (Signed)
Pt notified of carotid results bby phone with verbal understanding.

## 2016-03-31 ENCOUNTER — Encounter: Payer: Self-pay | Admitting: Physician Assistant

## 2016-03-31 NOTE — Progress Notes (Signed)
Cardiology Office Note:    Date:  04/01/2016   ID:  Jesse Page, DOB 05/07/1961, MRN [id number]  PCP:  Carmin RichmondAVIS,JAMES W, MD  Cardiologist:  Dr. Marca Anconaalton McLean   Electrophysiologist:  n/a  Referring MD: Carmin Richmondavis, James W, MD   Chief Complaint  Patient presents with  . Follow-up    CAD, recent stress test    History of Present Illness:    Jesse Page is a 55 y.o. male with a hx of tobacco abuse, CAD.  He was admitted in 12/10 with [place]. LHC showed significant mid RCA stenosis and apical LAD stenosis. He underwent PCI with Xience DES to the mid RCA. LHC in 8/11 (recurrent CP and + CP on treadmill for Nuc study) showed moderate, non-flow limiting disease in the LAD and mild in-stent restenosis in the RCA. No intervention.  He developed recurrent chest pain episodes in the summer of 2012. Lexiscan myoview in 7/12 showed EF 54%, no ischemia or infarction. He had recurrent symptoms and underwent LHC in 9/15 showed 90% distal LAD stenosis (stable from past) and 70% ostial ramus stenosis. FFR was done on the ramus lesion, result was 0.86 (not significant). He was treated medically. He had response to Isosorbide but could not tolerate this due to severe HA.    In 9/16, he was set up for a LHC 2/2 chest pain. LHC demonstrated patent RCA stent. He did have distal LAD stenosis up to 80%. The vessel was small in caliber at this point. It was thought that this could be the source of his chest discomfort. Overall, anatomy was stable compared to previous cardiac catheterization. He was treated medically and Ranexa was added.    I saw him 2 weeks ago with complaints of increasing exertional angina and palpitations. This would typically occur while mowing his yard with a push mower in extreme temperatures. He had relief with rest and nitroglycerin. He was not taking Ranexa twice a day. We increased his amlodipine to 2.5 mg twice a day. I also asked him to increase his Ranexa to twice a day dosing as  prescribed. Nuclear stress test was low risk with prior inferior infarct with mild peri-infarct ischemia, EF 45%. I reviewed this with Dr. Shirlee LatchMcLean. Nuclear stress test in 2012 did not have inferior defect. Since findings were low risk, it was felt that medical therapy could be continued if his symptoms were improved. Otherwise, he will need cardiac catheterization. Holter monitor did demonstrate PVCs and short runs of SVT-? Atrial tachycardia. He returns for FU.  He returns with his wife.  He notes less angina since we made his medication changes.  He has not used any NTG since last seen.  He still has occasional palpitations.  He denies syncope or near syncope. He does get dizzy with standing sometimes.  He denies orthopnea, PND, edema.     Prior CV studies that were reviewed today include:    Holter 8/17 1. Occasional PVCs.  2.3 short runs of SVT, ?atrial tachycardia.  Carotid US 03/25/16 LICA 1-39% >> FU prn  Myoview 03/22/16 Low risk stress nuclear study with prior inferior infarct and mild peri-infarct ischemia; EF 45 with mild diffuse hypokinesis; mild LVE.  LHC 9/16 Left Main No significant disease.  LAD - Ectatic prox to mid, 30% prox, 30% mid, diffuse up to 80% distal LAD (small in caliber)  RI - Moderate vessel with 50-60% ostial - similar to better than on prior cath.  LCx - Small, luminal irregularities.  RCA - mid  RCA stent with 30% ISR in the proximal portion of the stent. 30% distal EF 60%, normal wall motion.  CONCLUSION: Stable appearing coronaries. The ostial ramus stenosis looks similar to improved compared to the prior cath where we did FFR and stenosis was not hemodynamically significant. Most likely source of chest pain is the diffusely diseased distal LAD (up to 80%). The vessel is small caliber at this point and not a good interventional target. Symptoms began after the patient stopped his Ranexa and Coreg. He restarted on Friday and has felt better since. Also  possible coronary vasospasm related to his smoking.  PLAN: I want him to continue the ranolazine and Coreg. He will start on amlodipine 2.5 mg daily for vasospasm and angina. He was strongly urged to quit smoking. He has had a headache with Imdur in the past. Will have him followup in 2 wks.   Carotid 10/15 Heterogeneous plaque, bilaterally. 40-59% RICA stenosis. 1-39% LICA stenosis. Normal subclavian arteries, bilaterally. Patent vertebral arteries with antegrade flow. f/u 1 year  Myoview 7/12  Overall Impression: Normal stress nuclear study.No evidence of ischemia. Normal LV function.  Past Medical History:  Diagnosis Date  . CAD (coronary artery disease)    a. [place] 12/10 >> s/p Xience DES to mRCA.// b. LHC 8/11 - patent RCA stent, stable anatomy  //  c. LHC 9/15 -  90% distal LAD stenosis (stable from past) and 70% ostial ramus stenosis.  FFR was done on the ramus lesion, result was 0.86 (not significant). - med Rx. //  d. LHC 9/16 - pLAD 30, mLAD 30, dLAD diff 80 (small), oRI 50-60 (unchanged), mRCA stent ok with 30% ISR, dRCA 30, EF 60% - med Rx  . Carotid artery disease (HCC)    A. Carotid US 10/15: R 40-59%, L 1-39% >> FU 1 year  . Depression   . History of echocardiogram    a. Echo (2/11): EF 55-60%, normal wall motion, no significant valvular abnormalities.  . History of nuclear stress test    a. Myoview 7/12 - Overall Impression:  Normal stress nuclear study.  No evidence of ischemia.  Normal LV function. //  b. Myoview 8/17: inf infarct with mild peri-infarct ischemia, EF 45%; Low Risk  . History of pulmonary embolism 2009   hx of segmental Rt lower lobe acute pulmonary emboli w/no evidence of rt heart strain and overall small clot burden. in 2009. pt no lnger taking coumadin   . HLD (hyperlipidemia)   . HTN (hypertension)   . Palpitations    PVCs noted in past  //  Holter 8/17: 1. Occasional PVCs. 2.3 short runs of SVT, ?atrial tachycardia.   . Tobacco abuse     quit 12/11 but restarted.  PFTs in 8/13 were normal.  He was unable to tolerate Chantix or wellbutrin.     Past Surgical History:  Procedure Laterality Date  . angioplasty    . CARDIAC CATHETERIZATION N/A 04/19/2015   Procedure: Left Heart Cath and Coronary Angiography;  Surgeon: Laurey Moralealton S McLean, MD;  [facility];  Service: Cardiovascular;  Laterality: N/A;  . HIP SURGERY    . LEFT HEART CATHETERIZATION WITH CORONARY ANGIOGRAM N/A 04/27/2014   Procedure: LEFT HEART CATHETERIZATION WITH CORONARY ANGIOGRAM;  Surgeon: Laurey Moralealton S McLean, MD;  [facility];  Service: Cardiovascular;  Laterality: N/A;    Current Medications: Outpatient Medications Prior to Visit  Medication Sig Dispense Refill  . amLODipine (NORVASC) 2.5 MG tablet Take 1 tablet (2.5  mg total) by mouth 2 (two) times daily. 180 tablet 3  . aspirin EC 81 MG tablet Take 1 tablet (81 mg total) by mouth daily.    Marland Kitchen atorvastatin (LIPITOR) 80 MG tablet TAKE 1 TABLET (80 MG TOTAL) BY MOUTH DAILY. 90 tablet 2  . carvedilol (COREG) 6.25 MG tablet Take 1 tablet (6.25 mg total) by mouth 2 (two) times daily. 180 tablet 1  . citalopram (CELEXA) 20 MG tablet Take 1 tablet by mouth daily.    . clopidogrel (PLAVIX) 75 MG tablet TAKE 1 TABLET (75 MG TOTAL) BY MOUTH DAILY. 90 tablet 3  . nitroGLYCERIN (NITROSTAT) 0.4 MG SL tablet Place 0.4 mg under the tongue every 5 (five) minutes as needed for chest pain.    . ranolazine (RANEXA) 1000 MG SR tablet Take 1 tablet (1,000 mg total) by mouth 2 (two) times daily. 180 tablet 1  . Tamsulosin HCl (FLOMAX PO) Take 1 capsule by mouth daily.     Facility-Administered Medications Prior to Visit  Medication Dose Route Frequency Provider Last Rate Last Dose  . 0.9 %  sodium chloride infusion   Intravenous Continuous Laurey Morale, MD          Allergies:   Penicillins   Social History   Social History  . Marital status: Married    Spouse name: N/A  . Number of children: N/A  .  Years of education: N/A   Occupational History  . paramedic    Social History Main Topics  . Smoking status: Current Every Day Smoker    Packs/day: 1.50    Years: 34.00    Types: Cigarettes  . Smokeless tobacco: Never Used     Comment: smokes 1 ppd; quit 12/11   . Alcohol use Yes     Comment: occasional   . Drug use: No  . Sexual activity: Not Asked   Other Topics Concern  . None   Social History Narrative   Married, lives in [place]. Paramedic in [employer]      Family History:  The patient's family history includes Cancer in his paternal grandmother; Coronary artery disease in his father; Emphysema in his mother.   ROS:   Please see the history of present illness.    Review of Systems  Cardiovascular: Positive for chest pain, dyspnea on exertion and irregular heartbeat.   All other systems reviewed and are negative.   EKGs/Labs/Other Test Reviewed:    EKG:  EKG is  ordered today.  The ekg ordered today demonstrates Sinus bradycardia, HR 55, normal axis, PVCs, septal Q waves, QTc 419 ms, no change from prior tracing  Recent Labs: 04/18/2015: Hemoglobin 15.9; Platelets 252 03/18/2016: ALT 26; BUN 14; Creat 0.98; Potassium 4.3; Sodium 140   Recent Lipid Panel    Component Value Date/Time   CHOL 116 (L) 03/18/2016 1118   TRIG 106 03/18/2016 1118   HDL 39 (L) 03/18/2016 1118   CHOLHDL 3.0 03/18/2016 1118   VLDL 21 03/18/2016 1118   LDLCALC 56 03/18/2016 1118     Physical Exam:    VS:  BP 120/60   Pulse (!) 55   Ht 5\' 11"  (1.803 m)   Wt 190 lb 1.9 oz (86.2 kg)   BMI 26.52 kg/m     Wt Readings from Last 3 Encounters:  04/01/16 190 lb 1.9 oz (86.2 kg)  03/18/16 191 lb (86.6 kg)  04/19/15 191 lb (86.6 kg)     Physical Exam  Constitutional: He is oriented to  person, place, and time. He appears well-developed and well-nourished. No distress.  HENT:  Head: Normocephalic and atraumatic.  Neck: Normal range of motion. No JVD present.  Cardiovascular:  Normal rate, regular rhythm, S1 normal and S2 normal.  Exam reveals no gallop.   No murmur heard. Pulmonary/Chest: Effort normal and breath sounds normal. He has no wheezes. He has no rhonchi. He has no rales.  Abdominal: Soft. There is no tenderness.  Musculoskeletal: He exhibits no edema.  Neurological: He is alert and oriented to person, place, and time.  Skin: Skin is warm and dry.  Psychiatric: He has a normal mood and affect.    ASSESSMENT:    1. Coronary artery disease involving native coronary artery of native heart with angina pectoris (HCC)   2. HLD (hyperlipidemia)   3. Palpitations   4. Left-sided carotid artery disease (HCC)   5. Smoking    PLAN:    In order of problems listed above:  1. CAD - He is status post DES to the RCA in 2010. LHC in 2015 with 70% ostial RI. This was not hemodynamically significant by FFR. Last LHC in 9/16 with stable anatomy. He had diffuse distal small vessel disease in the LAD. He was recently seen for recurrent angina and described CCS Class II symptoms.  I adjusted his med Rx and had him do a nuclear stress test. This was low risk but did show a new inferior defect with mild peri-infarct ischemia.  The patient notes improved anginal symptoms with Med Rx.  We discussed the potential for proceeding with cardiac cath should his symptoms progress.  For now, will continue his current medical regimen which includes ASA, Plavix, Carvedilol, Ranolazine, Amlodipine, Atorvastatin.  I will bring him back for close FU with Dr. Shirlee Latch.  He knows to return sooner should he have a change in his symptoms.    2. HL - LDL in 8/17 was 56.  Continue high-dose statin.  3. Carotid artery disease - Recent carotid US with LICA 1-39%. As needed and follow-up recommended.  Continue ASA, statin.   4. Palpitations -  Recent Holter with PVCs and short/very brief runs of SVT/?ATach.  Continue beta-blocker.  HR too slow to advance beta blocker dose.  BMET, Mg2+ today.        5. Smoking -  We again discussed the need to quit.  He is willing to try Nicotine Patches.  We discussed setting a quit date and the importance of not smoking with a nicotine patch on.     Medication Adjustments/Labs and Tests Ordered: Current medicines are reviewed at length with the patient today.  Concerns regarding medicines are outlined above.  Medication changes, Labs and Tests ordered today are outlined in the Patient Instructions noted below. Patient Instructions  Medication Instructions:  Your physician recommends that you continue on your current medications as directed. Please refer to the Current Medication list given to you today. Labwork: TODAY BMET, MAGNESIUM LEVEL Testing/Procedures: NONE Follow-Up: 06/17/16 @ 3:45 WITH DR. Shirlee Latch  Any Other Special Instructions Will Be Listed Below (If Applicable). If you need a refill on your cardiac medications before your next appointment, please call your pharmacy.  Signed, Tereso Newcomer, PA-C  04/01/2016 9:30 AM    [facility] [address] [place], [place], [place]  [address] Phone: [phone]; Fax: [phone]

## 2016-04-01 ENCOUNTER — Ambulatory Visit (INDEPENDENT_AMBULATORY_CARE_PROVIDER_SITE_OTHER): Payer: BLUE CROSS/BLUE SHIELD | Admitting: Physician Assistant

## 2016-04-01 ENCOUNTER — Encounter (INDEPENDENT_AMBULATORY_CARE_PROVIDER_SITE_OTHER): Payer: Self-pay

## 2016-04-01 ENCOUNTER — Encounter: Payer: Self-pay | Admitting: Physician Assistant

## 2016-04-01 VITALS — BP 120/60 | HR 55 | Ht 71.0 in | Wt 190.1 lb

## 2016-04-01 DIAGNOSIS — Z720 Tobacco use: Secondary | ICD-10-CM

## 2016-04-01 DIAGNOSIS — E785 Hyperlipidemia, unspecified: Secondary | ICD-10-CM

## 2016-04-01 DIAGNOSIS — I25119 Atherosclerotic heart disease of native coronary artery with unspecified angina pectoris: Secondary | ICD-10-CM | POA: Diagnosis not present

## 2016-04-01 DIAGNOSIS — I739 Peripheral vascular disease, unspecified: Secondary | ICD-10-CM

## 2016-04-01 DIAGNOSIS — IMO0001 Reserved for inherently not codable concepts without codable children: Secondary | ICD-10-CM

## 2016-04-01 DIAGNOSIS — R002 Palpitations: Secondary | ICD-10-CM

## 2016-04-01 DIAGNOSIS — I779 Disorder of arteries and arterioles, unspecified: Secondary | ICD-10-CM

## 2016-04-01 DIAGNOSIS — F17200 Nicotine dependence, unspecified, uncomplicated: Secondary | ICD-10-CM

## 2016-04-01 LAB — BASIC METABOLIC PANEL
BUN: 14 mg/dL (ref 7–25)
CALCIUM: 9.2 mg/dL (ref 8.6–10.3)
CHLORIDE: 106 mmol/L (ref 98–110)
CO2: 26 mmol/L (ref 20–31)
CREATININE: 0.94 mg/dL (ref 0.70–1.33)
GLUCOSE: 106 mg/dL — AB (ref 65–99)
Potassium: 4.1 mmol/L (ref 3.5–5.3)
Sodium: 141 mmol/L (ref 135–146)

## 2016-04-01 LAB — MAGNESIUM: Magnesium: 1.9 mg/dL (ref 1.5–2.5)

## 2016-04-01 NOTE — Patient Instructions (Addendum)
Medication Instructions:  Your physician recommends that you continue on your current medications as directed. Please refer to the Current Medication list given to you today. Labwork: TODAY BMET, MAGNESIUM LEVEL Testing/Procedures: NONE Follow-Up: 06/17/16 @ 3:45 WITH DR. Shirlee LatchMCLEAN  Any Other Special Instructions Will Be Listed Below (If Applicable). If you need a refill on your cardiac medications before your next appointment, please call your pharmacy.

## 2016-04-03 ENCOUNTER — Telehealth: Payer: Self-pay | Admitting: *Deleted

## 2016-04-03 NOTE — Telephone Encounter (Signed)
Pt notified of lab results by phone with verbal understanding.  

## 2016-04-03 NOTE — Telephone Encounter (Signed)
Lvm with woman who answered phone. She asked if I could call back in about an hour, that pt had just gone out to run an errand.

## 2016-05-12 ENCOUNTER — Other Ambulatory Visit: Payer: Self-pay | Admitting: Cardiology

## 2016-05-13 ENCOUNTER — Other Ambulatory Visit: Payer: Self-pay | Admitting: Cardiology

## 2016-05-13 NOTE — Telephone Encounter (Signed)
amLODipine (NORVASC) 2.5 MG tablet  Medication  Date: 03/18/2016 [facility] [address] Office Ordering/Authorizing: Beatrice LecherScott T Weaver, PA-C  Order Providers   Prescribing Provider Encounter Provider  Beatrice LecherScott T Weaver, PA-C Beatrice LecherScott T Weaver, PA-C  Supervision Information   Supervising Provider Type of Supervision  Will Jorja LoaMartin Camnitz, MD Incident To  Medication Detail    Disp Refills Start End   amLODipine (NORVASC) 2.5 MG tablet 180 tablet 3 03/18/2016    Sig - Route: Take 1 tablet (2.5 mg total) by mouth 2 (two) times daily. - Oral   E-Prescribing Status: Receipt confirmed by pharmacy (03/18/2016 10:35 AM EDT)   Pharmacy   [facility] [phone] - [place], [place] - [address]

## 2016-06-09 ENCOUNTER — Other Ambulatory Visit: Payer: Self-pay | Admitting: Cardiology

## 2016-06-17 ENCOUNTER — Ambulatory Visit: Payer: BLUE CROSS/BLUE SHIELD | Admitting: Cardiology

## 2016-06-19 ENCOUNTER — Encounter: Payer: Self-pay | Admitting: Cardiology

## 2016-06-26 ENCOUNTER — Other Ambulatory Visit: Payer: Self-pay | Admitting: Cardiology

## 2016-09-03 ENCOUNTER — Other Ambulatory Visit: Payer: Self-pay | Admitting: Cardiology

## 2016-09-15 NOTE — Progress Notes (Deleted)
Cardiology Office Note   Date:  09/15/2016   ID:  Jesse Page, DOB 05/16/1961, MRN [id number]  PCP:  Carmin RichmondAVIS,JAMES W, MD  Cardiologist:   Chilton Siiffany Huntley, MD   No chief complaint on file.     History of Present Illness: Jesse Page is a 56 y.o. male with CAD s/p PCI, hypertension, hyperlipidemia, tobacco abuse and moderate carotid stenosis who presents for follow up. Jesse Page was previously a patient of Dr. Shirlee LatchMcLean.  He was admitted 07/2009 with unstable angina.  LHCshowed significant mid RCA stenosis and apical LAD stenosis. He underwent PCI with Xience DES to the mid RCA. LHC in 8/11 (recurrent CP and + CP on treadmill for Nuc study)showed moderate, non-flow limiting disease in the LAD and mild in-stent restenosis in the RCA.  This was managed medically.  He developed recurrent chest pain episodes in the summer of 2012. Lexiscan myoview in 7/12 showed EF 54%, no ischemia or infarction. He had recurrent symptoms and underwent LHC in 9/15showed 90% distal LAD stenosis (stable from past) and 70% ostial ramus stenosis. FFR was done on the ramus lesion, result was 0.86 (not significant). He was treated medically. He had response to Isosorbide but could not tolerate this due to headache.  [facility] demonstrated a patent RCA stent and 80% distal LAD stenosis.  Ranexa was added to his regimen.  He continued to have exertional angina so amlodipine was added to his regimen and he was instructed to take Ranexa bid as prescribed.  Nuclear stress testing 03/22/16 showed LVEF 45% with basal to mid inferior infarct with peri-infarct ischemia, which was new.  Given that his symptoms were improved he elected not to pursue recurrent LHC.  Jesse Page also had a 48 hour Holter 03/22/16 that showed occasional PVCs and three short runs of SVT. His heart rate was too low to titrate his beta blocker.    Past Medical History:  Diagnosis Date  . CAD (coronary artery disease)      a. [place] 12[place] >> s/p Xience DES to mRCA.// b. LHC 8/11 - patent RCA stent, stable anatomy  //  c. LHC 9/15 -  90% distal LAD stenosis (stable from past) and 70% ostial ramus stenosis.  FFR was done on the ramus lesion, result was 0.86 (not significant). - med Rx. //  d. LHC 9/16 - pLAD 30, mLAD 30, dLAD diff 80 (small), oRI 50-60 (unchanged), mRCA stent ok with 30% ISR, dRCA 30, EF 60% - med Rx  . Carotid artery disease (HCC)    A. Carotid US 10/15: R 40-59%, L 1-39% >> FU 1 year  . Depression   . History of echocardiogram    a. Echo (2/11): EF 55-60%, normal wall motion, no significant valvular abnormalities.  . History of nuclear stress test    a. Myoview 7/12 - Overall Impression:  Normal stress nuclear study.  No evidence of ischemia.  Normal LV function. //  b. Myoview 8/17: inf infarct with mild peri-infarct ischemia, EF 45%; Low Risk  . History of pulmonary embolism 2009   hx of segmental Rt lower lobe acute pulmonary emboli w/no evidence of rt heart strain and overall small clot burden. in 2009. pt no lnger taking coumadin   . HLD (hyperlipidemia)   . HTN (hypertension)   . Palpitations    PVCs noted in past  //  Holter 8/17: 1. Occasional PVCs. 2.3 short runs of SVT, ?atrial tachycardia.   . Tobacco abuse  quit 12/11 but restarted.  PFTs in 8/13 were normal.  He was unable to tolerate Chantix or wellbutrin.     Past Surgical History:  Procedure Laterality Date  . angioplasty    . CARDIAC CATHETERIZATION N/A 04/19/2015   Procedure: Left Heart Cath and Coronary Angiography;  Surgeon: Laurey Morale, MD;  Location: [facility] INVASIVE CV LAB;  Service: Cardiovascular;  Laterality: N/A;  . HIP SURGERY    . LEFT HEART CATHETERIZATION WITH CORONARY ANGIOGRAM N/A 04/27/2014   Procedure: LEFT HEART CATHETERIZATION WITH CORONARY ANGIOGRAM;  Surgeon: Laurey Morale, MD;  Location: [facility] CATH LAB;  Service: Cardiovascular;  Laterality: N/A;     Current Outpatient Prescriptions  Medication Sig  Dispense Refill  . amLODipine (NORVASC) 2.5 MG tablet Take 1 tablet (2.5 mg total) by mouth 2 (two) times daily. 180 tablet 3  . aspirin EC 81 MG tablet Take 1 tablet (81 mg total) by mouth daily.    Marland Kitchen atorvastatin (LIPITOR) 80 MG tablet TAKE 1 TABLET (80 MG TOTAL) BY MOUTH DAILY. 30 tablet 0  . carvedilol (COREG) 6.25 MG tablet TAKE 1 TABLET BY MOUTH 2 TIMES DAILY. 180 tablet 3  . citalopram (CELEXA) 20 MG tablet Take 1 tablet by mouth daily.    . clopidogrel (PLAVIX) 75 MG tablet TAKE 1 TABLET (75 MG TOTAL) BY MOUTH DAILY. 90 tablet 3  . nitroGLYCERIN (NITROSTAT) 0.4 MG SL tablet Place 0.4 mg under the tongue every 5 (five) minutes as needed for chest pain.    Marland Kitchen RANEXA 1000 MG SR tablet TAKE 1 TABLET BY MOUTH 2 TIMES DAILY. 180 tablet 2  . tamsulosin (FLOMAX) 0.4 MG CAPS capsule TAKE ONE CAPSULE BY MOUTH DAILY     Current Facility-Administered Medications  Medication Dose Route Frequency Provider Last Rate Last Dose  . 0.9 %  sodium chloride infusion   Intravenous Continuous Laurey Morale, MD        Allergies:   Penicillins    Social History:  The patient  reports that he has been smoking Cigarettes.  He has a 51.00 pack-year smoking history. He has never used smokeless tobacco. He reports that he drinks alcohol. He reports that he does not use drugs.   Family History:  The patient's ***family history includes Cancer in his paternal grandmother; Coronary artery disease in his father; Emphysema in his mother.    ROS:  Please see the history of present illness.   Otherwise, review of systems are positive for {NONE DEFAULTED:18576::"none"}.   All other systems are reviewed and negative.    PHYSICAL EXAM: VS:  There were no vitals taken for this visit. , BMI There is no height or weight on file to calculate BMI. GENERAL:  Well appearing HEENT:  Pupils equal round and reactive, fundi not visualized, oral mucosa unremarkable NECK:  No jugular venous distention, waveform within normal  limits, carotid upstroke brisk and symmetric, no bruits, no thyromegaly LYMPHATICS:  No cervical adenopathy LUNGS:  Clear to auscultation bilaterally HEART:  RRR.  PMI not displaced or sustained,S1 and S2 within normal limits, no S3, no S4, no clicks, no rubs, *** murmurs ABD:  Flat, positive bowel sounds normal in frequency in pitch, no bruits, no rebound, no guarding, no midline pulsatile mass, no hepatomegaly, no splenomegaly EXT:  2 plus pulses throughout, no edema, no cyanosis no clubbing SKIN:  No rashes no nodules NEURO:  Cranial nerves II through XII grossly intact, motor grossly intact throughout PSYCH:  Cognitively intact, oriented to person place  and time    EKG:  EKG {ACTION; IS/IS ZOX:09604540} ordered today. The ekg ordered today demonstrates ***    Lexiscan Myoview 03/22/16:  The left ventricular ejection fraction is mildly decreased (45-54%).  Nuclear stress EF: 45%.  There was no ST segment deviation noted during stress.  Defect 1: There is a medium defect of moderate severity present in the basal inferior and mid inferior location.  This is a low risk study.   Low risk stress nuclear study with prior inferior infarct and mild peri-infarct ischemia; EF 45 with mild diffuse hypokinesis; mild LVE.  Carotid ultrasound 03/25/16: 1-39% ICA stenosis bilaterally  48 hour Holter 03/22/16: 1. Occasional PVCs.  2.3 short runs of SVT, ?atrial tachycardia.   LHC 04/19/15: 30% mid LAD, 80% distal LAD, 50-60% RI, RCA stent patent with 30% in-stent restenosis, 30% distal RCA  Recent Labs: 03/18/2016: ALT 26 04/01/2016: BUN 14; Creat 0.94; Magnesium 1.9; Potassium 4.1; Sodium 141    Lipid Panel    Component Value Date/Time   CHOL 116 (L) 03/18/2016 1118   TRIG 106 03/18/2016 1118   HDL 39 (L) 03/18/2016 1118   CHOLHDL 3.0 03/18/2016 1118   VLDL 21 03/18/2016 1118   LDLCALC 56 03/18/2016 1118      Wt Readings from Last 3 Encounters:  04/01/16 86.2 kg (190 lb 1.9 oz)    03/18/16 86.6 kg (191 lb)  04/19/15 86.6 kg (191 lb)      ASSESSMENT AND PLAN:  ***   Current medicines are reviewed at length with the patient today.  The patient {ACTIONS; HAS/DOES NOT HAVE:19233} concerns regarding medicines.  The following changes have been made:  {PLAN; NO CHANGE:13088:s}  Labs/ tests ordered today include: *** No orders of the defined types were placed in this encounter.    Disposition:   FU with ***    This note was written with the assistance of speech recognition software.  Please excuse any transcriptional errors.  Signed, Trask Vosler C. Duke Salvia, MD, [facility]  09/15/2016 8:18 AM    [facility]

## 2016-09-16 ENCOUNTER — Ambulatory Visit: Payer: BLUE CROSS/BLUE SHIELD | Admitting: Cardiovascular Disease

## 2016-09-24 ENCOUNTER — Ambulatory Visit (INDEPENDENT_AMBULATORY_CARE_PROVIDER_SITE_OTHER): Payer: BLUE CROSS/BLUE SHIELD | Admitting: Cardiovascular Disease

## 2016-09-24 ENCOUNTER — Encounter: Payer: Self-pay | Admitting: Cardiovascular Disease

## 2016-09-24 VITALS — BP 142/90 | HR 60 | Ht 71.0 in | Wt 200.4 lb

## 2016-09-24 DIAGNOSIS — I208 Other forms of angina pectoris: Secondary | ICD-10-CM | POA: Diagnosis not present

## 2016-09-24 DIAGNOSIS — I119 Hypertensive heart disease without heart failure: Secondary | ICD-10-CM

## 2016-09-24 DIAGNOSIS — I25119 Atherosclerotic heart disease of native coronary artery with unspecified angina pectoris: Secondary | ICD-10-CM | POA: Diagnosis not present

## 2016-09-24 DIAGNOSIS — Z01812 Encounter for preprocedural laboratory examination: Secondary | ICD-10-CM

## 2016-09-24 DIAGNOSIS — E7800 Pure hypercholesterolemia, unspecified: Secondary | ICD-10-CM

## 2016-09-24 DIAGNOSIS — I493 Ventricular premature depolarization: Secondary | ICD-10-CM

## 2016-09-24 LAB — CBC WITH DIFFERENTIAL/PLATELET
Basophils Absolute: 0 cells/uL (ref 0–200)
Basophils Relative: 0 %
EOS PCT: 4 %
Eosinophils Absolute: 392 cells/uL (ref 15–500)
HEMATOCRIT: 51.4 % — AB (ref 38.5–50.0)
Hemoglobin: 17.6 g/dL — ABNORMAL HIGH (ref 13.2–17.1)
LYMPHS PCT: 28 %
Lymphs Abs: 2744 cells/uL (ref 850–3900)
MCH: 32.7 pg (ref 27.0–33.0)
MCHC: 34.2 g/dL (ref 32.0–36.0)
MCV: 95.4 fL (ref 80.0–100.0)
MONOS PCT: 7 %
MPV: 9.9 fL (ref 7.5–12.5)
Monocytes Absolute: 686 cells/uL (ref 200–950)
NEUTROS PCT: 61 %
Neutro Abs: 5978 cells/uL (ref 1500–7800)
PLATELETS: 229 10*3/uL (ref 140–400)
RBC: 5.39 MIL/uL (ref 4.20–5.80)
RDW: 13.7 % (ref 11.0–15.0)
WBC: 9.8 10*3/uL (ref 3.8–10.8)

## 2016-09-24 LAB — BASIC METABOLIC PANEL
BUN: 15 mg/dL (ref 7–25)
CO2: 29 mmol/L (ref 20–31)
Calcium: 9.2 mg/dL (ref 8.6–10.3)
Chloride: 102 mmol/L (ref 98–110)
Creat: 1.32 mg/dL (ref 0.70–1.33)
Glucose, Bld: 125 mg/dL — ABNORMAL HIGH (ref 65–99)
Potassium: 4.9 mmol/L (ref 3.5–5.3)
SODIUM: 138 mmol/L (ref 135–146)

## 2016-09-24 LAB — PROTIME-INR
INR: 1
Prothrombin Time: 11.1 s (ref 9.0–11.5)

## 2016-09-24 MED ORDER — PANTOPRAZOLE SODIUM 40 MG PO TBEC: DELAYED_RELEASE_TABLET | ORAL | 5 refills | Status: DC

## 2016-09-24 NOTE — Progress Notes (Signed)
Cardiology Office Note   Date:  09/24/2016   ID:  Jesse Page, DOB 09/08/1960, MRN [id number]  PCP:  Carmin RichmondAVIS,JAMES W, MD  Cardiologist:   Chilton Siiffany Ghent, MD   Chief Complaint  Patient presents with  . New Patient (Initial Visit)  . Shortness of Breath    with exertion  . Chest Pain    with exertion.        History of Present Illness: Jesse Page is a 56 y.o. male with CAD s/p PCI, hypertension, hyperlipidemia, tobacco abuse and moderate carotid stenosis who presents for follow up. Jesse Page was previously a patient of Dr. Shirlee LatchMcLean.  He was admitted 07/2009 with unstable angina.  LHCshowed significant mid RCA stenosis and apical LAD stenosis. He underwent PCI with Xience DES to the mid RCA. LHC in 8/11 (recurrent CP and + CP on treadmill for Nuc study)showed moderate, non-flow limiting disease in the LAD and mild in-stent restenosis in the RCA.  This was managed medically.  He developed recurrent chest pain episodes in the summer of 2012. Lexiscan myoview in 7/12 showed EF 54%, no ischemia or infarction. He had recurrent symptoms and underwent LHC in 9/15showed 90% distal LAD stenosis (stable from past) and 70% ostial ramus stenosis. FFR was done on the ramus lesion, result was 0.86 (not significant). He was treated medically. He had response to isosorbide but could not tolerate this due to headache.  [facility] demonstrated a patent RCA stent and 80% distal LAD stenosis.  Ranexa was added to his regimen.  He continued to have exertional angina so amlodipine was added to his regimen and he was instructed to take Ranexa bid as prescribed.  Nuclear stress testing 03/22/16 showed LVEF 45% with basal to mid inferior infarct with peri-infarct ischemia, which was new.  Given that his symptoms were improved he elected not to pursue recurrent LHC.  Jesse Page also had a 48 hour Holter 03/22/16 that showed occasional PVCs and three short runs of SVT. His heart  rate was too low to titrate his beta blocker.  He notes that his PVCs are getting worse.  Two weeks ago he had PVCs that lasted over an hour.  [facility] and noted that he had intermittent PVCs while there but no sustained arrhythmias.  He reports that his electrolytes were normal.  They always occur after eating and sometimes when he is laying down.  For the last 6-7 weeks he also notes intense heartburn.  He has been taking Zantac daily.  He hasn't been exercising because he gets chest pain and shortness of breath with exertion.  He denies lower extremity edema, orthopnea or PND.  He continues to smoke 1ppd.  He has tried Wellbutrin and Chantix but didn't like how they made him feel.  He tried patches which helped somewhat.   Past Medical History:  Diagnosis Date  . CAD (coronary artery disease)    a. [place] 12[place] >> s/p Xience DES to mRCA.// b. LHC 8/11 - patent RCA stent, stable anatomy  //  c. LHC 9/15 -  90% distal LAD stenosis (stable from past) and 70% ostial ramus stenosis.  FFR was done on the ramus lesion, result was 0.86 (not significant). - med Rx. //  d. LHC 9/16 - pLAD 30, mLAD 30, dLAD diff 80 (small), oRI 50-60 (unchanged), mRCA stent ok with 30% ISR, dRCA 30, EF 60% - med Rx  . Carotid artery disease (HCC)    A. Carotid  US 10/15: R 40-59%, L 1-39% >> FU 1 year  . Depression   . History of echocardiogram    a. Echo (2/11): EF 55-60%, normal wall motion, no significant valvular abnormalities.  . History of nuclear stress test    a. Myoview 7/12 - Overall Impression:  Normal stress nuclear study.  No evidence of ischemia.  Normal LV function. //  b. Myoview 8/17: inf infarct with mild peri-infarct ischemia, EF 45%; Low Risk  . History of pulmonary embolism 2009   hx of segmental Rt lower lobe acute pulmonary emboli w/no evidence of rt heart strain and overall small clot burden. in 2009. pt no lnger taking coumadin   . HLD (hyperlipidemia)   . HTN (hypertension)   . Palpitations      PVCs noted in past  //  Holter 8/17: 1. Occasional PVCs. 2.3 short runs of SVT, ?atrial tachycardia.   . Tobacco abuse    quit 12/11 but restarted.  PFTs in 8/13 were normal.  He was unable to tolerate Chantix or wellbutrin.     Past Surgical History:  Procedure Laterality Date  . angioplasty    . CARDIAC CATHETERIZATION N/A 04/19/2015   Procedure: Left Heart Cath and Coronary Angiography;  Surgeon: Laurey Moralealton S McLean, MD;  [facility];  Service: Cardiovascular;  Laterality: N/A;  . HIP SURGERY    . LEFT HEART CATHETERIZATION WITH CORONARY ANGIOGRAM N/A 04/27/2014   Procedure: LEFT HEART CATHETERIZATION WITH CORONARY ANGIOGRAM;  Surgeon: Laurey Moralealton S McLean, MD;  [facility];  Service: Cardiovascular;  Laterality: N/A;     Current Outpatient Prescriptions  Medication Sig Dispense Refill  . amLODipine (NORVASC) 2.5 MG tablet Take 1 tablet (2.5 mg total) by mouth 2 (two) times daily. 180 tablet 3  . aspirin EC 81 MG tablet Take 1 tablet (81 mg total) by mouth daily.    Marland Kitchen. atorvastatin (LIPITOR) 80 MG tablet TAKE 1 TABLET (80 MG TOTAL) BY MOUTH DAILY. 30 tablet 0  . carvedilol (COREG) 6.25 MG tablet TAKE 1 TABLET BY MOUTH 2 TIMES DAILY. 180 tablet 3  . citalopram (CELEXA) 20 MG tablet Take 1 tablet by mouth daily.    . clopidogrel (PLAVIX) 75 MG tablet TAKE 1 TABLET (75 MG TOTAL) BY MOUTH DAILY. 90 tablet 3  . nitroGLYCERIN (NITROSTAT) 0.4 MG SL tablet Place 0.4 mg under the tongue every 5 (five) minutes as needed for chest pain.    Marland Kitchen. RANEXA 1000 MG SR tablet TAKE 1 TABLET BY MOUTH 2 TIMES DAILY. 180 tablet 2  . tamsulosin (FLOMAX) 0.4 MG CAPS capsule TAKE ONE CAPSULE BY MOUTH DAILY    . pantoprazole (PROTONIX) 40 MG tablet Take 1 tablet by mouth twice a day for 2 weeks and then once daily 60 tablet 5   Current Facility-Administered Medications  Medication Dose Route Frequency Provider Last Rate Last Dose  . 0.9 %  sodium chloride infusion   Intravenous Continuous  Laurey Moralealton S McLean, MD        Allergies:   Penicillins    Social History:  The patient  reports that he has been smoking Cigarettes.  He has a 51.00 pack-year smoking history. He has never used smokeless tobacco. He reports that he drinks alcohol. He reports that he does not use drugs.   Family History:  The patient's family history includes Cancer in his paternal grandmother; Coronary artery disease in his father; Emphysema in his mother.    ROS:  Please see the history of  present illness.   Otherwise, review of systems are positive for none.   All other systems are reviewed and negative.    PHYSICAL EXAM: VS:  BP (!) 142/90   Pulse 60   Ht 5\' 11"  (1.803 m)   Wt 90.9 kg (200 lb 6.4 oz)   BMI 27.95 kg/m  , BMI Body mass index is 27.95 kg/m. GENERAL:  Well appearing HEENT:  Pupils equal round and reactive, fundi not visualized, oral mucosa unremarkable NECK:  No jugular venous distention, waveform within normal limits, carotid upstroke brisk and symmetric, no bruits, no thyromegaly LYMPHATICS:  No cervical adenopathy LUNGS:  Clear to auscultation bilaterally HEART:  RRR.  PMI not displaced or sustained,S1 and S2 within normal limits, no S3, no S4, no clicks, no rubs, no murmurs ABD:  Flat, positive bowel sounds normal in frequency in pitch, no bruits, no rebound, no guarding, no midline pulsatile mass, no hepatomegaly, no splenomegaly EXT:  2 plus pulses throughout, no edema, no cyanosis no clubbing SKIN:  No rashes no nodules NEURO:  Cranial nerves II through XII grossly intact, motor grossly intact throughout PSYCH:  Cognitively intact, oriented to person place and time    EKG:  EKG is not ordered today. The ekg ordered today demonstrates sinus rhyhhm.  R Iate 60 bpm.   Lexiscan Myoview 03/22/16:  The left ventricular ejection fraction is mildly decreased (45-54%).  Nuclear stress EF: 45%.  There was no ST segment deviation noted during stress.  Defect 1: There is a  medium defect of moderate severity present in the basal inferior and mid inferior location.  This is a low risk study.   Low risk stress nuclear study with prior inferior infarct and mild peri-infarct ischemia; EF 45 with mild diffuse hypokinesis; mild LVE.  Carotid ultrasound 03/25/16: 1-39% ICA stenosis bilaterally  48 hour Holter 03/22/16: 1. Occasional PVCs.  2.3 short runs of SVT, ?atrial tachycardia.   LHC 04/19/15: 30% mid LAD, 80% distal LAD, 50-60% RI, RCA stent patent with 30% in-stent restenosis, 30% distal RCA  Recent Labs: 03/18/2016: ALT 26 04/01/2016: BUN 14; Creat 0.94; Magnesium 1.9; Potassium 4.1; Sodium 141    Lipid Panel    Component Value Date/Time   CHOL 116 (L) 03/18/2016 1118   TRIG 106 03/18/2016 1118   HDL 39 (L) 03/18/2016 1118   CHOLHDL 3.0 03/18/2016 1118   VLDL 21 03/18/2016 1118   LDLCALC 56 03/18/2016 1118      Wt Readings from Last 3 Encounters:  09/24/16 90.9 kg (200 lb 6.4 oz)  04/01/16 86.2 kg (190 lb 1.9 oz)  03/18/16 86.6 kg (191 lb)      ASSESSMENT AND PLAN:  # Stable angina: # PVCs: Jesse Page had a Lexiscan Myoview that Showed prior infarct with peri-infarct ischemia in the inferior region. He continues to have angina with exertion despite Ranexa, carvedilol and amlodipine.  We will arrange for LHC to assess.  He has known distal LAD disease that has not been amenable to PCI.  Continue aspirin, clopidogrel, and atorvastatin.  # Hypertension: BP elevated today.  Repeat 138/74.  He will keep a log of his blood pressure and bring it to follow up.  Continue amlodipine and carvedilol.    # Hyperlipidemia: LDL 56.  Continue atorvastatin.  Current medicines are reviewed at length with the patient today.  The patient does not have concerns regarding medicines.  The following changes have been made:  no change  Labs/ tests ordered today include:   Orders  Placed This Encounter  Procedures  . CBC with Differential/Platelet  . Basic  metabolic panel  . INR/PT  . EKG 12-Lead     Disposition:   FU with Jameria Bradway C. Duke Salvia, MD, [facility] in 1 month    This note was written with the assistance of speech recognition software.  Please excuse any transcriptional errors.  Signed, Lennard Capek C. Duke Salvia, MD, [facility]  09/24/2016 1:17 PM    [facility]

## 2016-09-24 NOTE — Patient Instructions (Signed)
Medication Instructions:  START PROTONIX 40 MG TWICE A DAY FOR 2 WEEKS AND THEN DECREASE TO ONCE DAILY  Labwork: BMET/CBC/PT/INR AT [facility] ON THE FIRST FLOOR  Testing/Procedures: Your physician has requested that you have a cardiac catheterization. Cardiac catheterization is used to diagnose and/or treat various heart conditions. Doctors may recommend this procedure for a number of different reasons. The most common reason is to evaluate chest pain. Chest pain can be a symptom of coronary artery disease (CAD), and cardiac catheterization can show whether plaque is narrowing or blocking your heart's arteries. This procedure is also used to evaluate the valves, as well as measure the blood flow and oxygen levels in different parts of your heart. For further information please visit [email or web address]. Please follow instruction sheet, as given.  Follow-Up: Your physician recommends that you schedule a follow-up appointment in: 1 MONTH OV  Any Other Special Instructions Will Be Listed Below (If Applicable). You are scheduled for a cardiac catheterization on 09/25/16 with Dr. Tresa EndoKELLY or associate.  [facility] on 09/25/16 at 9:30 FOR 11:30 PROCEDURE .  Enter thru the [employer] entrance A No food or drink after midnight tonight. You may take your medications with a sip of water on the day of your procedure.

## 2016-09-25 ENCOUNTER — Ambulatory Visit (HOSPITAL_COMMUNITY)
Admission: RE | Admit: 2016-09-25 | Discharge: 2016-09-25 | Disposition: A | Discharge status: Home / Self Care | Payer: BLUE CROSS/BLUE SHIELD | Source: Ambulatory Visit | Attending: Cardiovascular Disease | Admitting: Cardiovascular Disease

## 2016-09-25 ENCOUNTER — Encounter (HOSPITAL_COMMUNITY)
Admission: RE | Disposition: A | Discharge status: Home / Self Care | Payer: Self-pay | Source: Ambulatory Visit | Attending: Cardiovascular Disease

## 2016-09-25 DIAGNOSIS — Z7982 Long term (current) use of aspirin: Secondary | ICD-10-CM | POA: Diagnosis not present

## 2016-09-25 DIAGNOSIS — Z7902 Long term (current) use of antithrombotics/antiplatelets: Secondary | ICD-10-CM | POA: Insufficient documentation

## 2016-09-25 DIAGNOSIS — F17210 Nicotine dependence, cigarettes, uncomplicated: Secondary | ICD-10-CM | POA: Insufficient documentation

## 2016-09-25 DIAGNOSIS — I471 Supraventricular tachycardia: Secondary | ICD-10-CM | POA: Diagnosis not present

## 2016-09-25 DIAGNOSIS — I25118 Atherosclerotic heart disease of native coronary artery with other forms of angina pectoris: Secondary | ICD-10-CM

## 2016-09-25 DIAGNOSIS — I493 Ventricular premature depolarization: Secondary | ICD-10-CM | POA: Insufficient documentation

## 2016-09-25 DIAGNOSIS — Z880 Allergy status to penicillin: Secondary | ICD-10-CM | POA: Insufficient documentation

## 2016-09-25 DIAGNOSIS — E785 Hyperlipidemia, unspecified: Secondary | ICD-10-CM | POA: Insufficient documentation

## 2016-09-25 DIAGNOSIS — F329 Major depressive disorder, single episode, unspecified: Secondary | ICD-10-CM | POA: Insufficient documentation

## 2016-09-25 DIAGNOSIS — I10 Essential (primary) hypertension: Secondary | ICD-10-CM | POA: Insufficient documentation

## 2016-09-25 DIAGNOSIS — I25110 Atherosclerotic heart disease of native coronary artery with unstable angina pectoris: Secondary | ICD-10-CM | POA: Diagnosis present

## 2016-09-25 DIAGNOSIS — Z86711 Personal history of pulmonary embolism: Secondary | ICD-10-CM | POA: Diagnosis not present

## 2016-09-25 DIAGNOSIS — Z8249 Family history of ischemic heart disease and other diseases of the circulatory system: Secondary | ICD-10-CM | POA: Diagnosis not present

## 2016-09-25 DIAGNOSIS — T82855A Stenosis of coronary artery stent, initial encounter: Secondary | ICD-10-CM | POA: Insufficient documentation

## 2016-09-25 DIAGNOSIS — Y712 Prosthetic and other implants, materials and accessory cardiovascular devices associated with adverse incidents: Secondary | ICD-10-CM | POA: Insufficient documentation

## 2016-09-25 HISTORY — PX: LEFT HEART CATH AND CORONARY ANGIOGRAPHY: CATH118249

## 2016-09-25 HISTORY — PX: INTRAVASCULAR PRESSURE WIRE/FFR STUDY: CATH118243

## 2016-09-25 LAB — POCT ACTIVATED CLOTTING TIME
ACTIVATED CLOTTING TIME: 241 s
Activated Clotting Time: 186 seconds

## 2016-09-25 SURGERY — LEFT HEART CATH AND CORONARY ANGIOGRAPHY
Anesthesia: LOCAL

## 2016-09-25 MED ORDER — SODIUM CHLORIDE 0.9 % IV SOLN: 250.0000 mL | INTRAVENOUS | Status: DC | PRN

## 2016-09-25 MED ORDER — ACETAMINOPHEN 325 MG PO TABS: 650.0000 mg | ORAL_TABLET | ORAL | Status: DC | PRN

## 2016-09-25 MED ORDER — FENTANYL CITRATE (PF) 100 MCG/2ML IJ SOLN
INTRAMUSCULAR | Status: DC | PRN
  Administered 2016-09-25: 50 ug via INTRAVENOUS

## 2016-09-25 MED ORDER — ONDANSETRON HCL 4 MG/2ML IJ SOLN: 4.0000 mg | Freq: Four times a day (QID) | INTRAMUSCULAR | Status: DC | PRN

## 2016-09-25 MED ORDER — HEPARIN SODIUM (PORCINE) 1000 UNIT/ML IJ SOLN
INTRAMUSCULAR | Status: AC
  Filled 2016-09-25: qty 1

## 2016-09-25 MED ORDER — IOPAMIDOL (ISOVUE-370) INJECTION 76%
INTRAVENOUS | Status: DC | PRN
  Administered 2016-09-25: 140 mL via INTRAVENOUS

## 2016-09-25 MED ORDER — MIDAZOLAM HCL 2 MG/2ML IJ SOLN
INTRAMUSCULAR | Status: AC
  Filled 2016-09-25: qty 2

## 2016-09-25 MED ORDER — IOPAMIDOL (ISOVUE-370) INJECTION 76%
INTRAVENOUS | Status: AC
  Filled 2016-09-25: qty 50

## 2016-09-25 MED ORDER — MIDAZOLAM HCL 2 MG/2ML IJ SOLN
INTRAMUSCULAR | Status: DC | PRN
  Administered 2016-09-25: 2 mg via INTRAVENOUS

## 2016-09-25 MED ORDER — SODIUM CHLORIDE 0.9% FLUSH: 3.0000 mL | Freq: Two times a day (BID) | INTRAVENOUS | Status: DC

## 2016-09-25 MED ORDER — SODIUM CHLORIDE 0.9% FLUSH: 3.0000 mL | INTRAVENOUS | Status: DC | PRN

## 2016-09-25 MED ORDER — HEPARIN SODIUM (PORCINE) 1000 UNIT/ML IJ SOLN
INTRAMUSCULAR | Status: DC | PRN
  Administered 2016-09-25: 5000 [IU] via INTRAVENOUS
  Administered 2016-09-25: 2500 [IU] via INTRAVENOUS

## 2016-09-25 MED ORDER — SODIUM CHLORIDE 0.9 % IV SOLN: INTRAVENOUS | Status: DC

## 2016-09-25 MED ORDER — ADENOSINE 12 MG/4ML IV SOLN
INTRAVENOUS | Status: AC
  Filled 2016-09-25: qty 16

## 2016-09-25 MED ORDER — ADENOSINE (DIAGNOSTIC) 140MCG/KG/MIN
INTRAVENOUS | Status: DC | PRN
  Administered 2016-09-25: 140 ug/kg/min via INTRAVENOUS

## 2016-09-25 MED ORDER — IOPAMIDOL (ISOVUE-370) INJECTION 76%
INTRAVENOUS | Status: AC
  Filled 2016-09-25: qty 100

## 2016-09-25 MED ORDER — HEPARIN (PORCINE) IN NACL 2-0.9 UNIT/ML-% IJ SOLN
INTRAMUSCULAR | Status: AC
  Filled 2016-09-25: qty 1000

## 2016-09-25 MED ORDER — FENTANYL CITRATE (PF) 100 MCG/2ML IJ SOLN
INTRAMUSCULAR | Status: AC
  Filled 2016-09-25: qty 2

## 2016-09-25 MED ORDER — ASPIRIN 81 MG PO CHEW: 81.0000 mg | CHEWABLE_TABLET | Freq: Every day | ORAL | Status: DC

## 2016-09-25 MED ORDER — SODIUM CHLORIDE 0.9 % WEIGHT BASED INFUSION
3.0000 mL/kg/h | INTRAVENOUS | Status: DC
  Administered 2016-09-25: 3 mL/kg/h via INTRAVENOUS

## 2016-09-25 MED ORDER — VERAPAMIL HCL 2.5 MG/ML IV SOLN
INTRAVENOUS | Status: AC
  Filled 2016-09-25: qty 2

## 2016-09-25 MED ORDER — ASPIRIN 81 MG PO CHEW: 81.0000 mg | CHEWABLE_TABLET | ORAL | Status: DC

## 2016-09-25 MED ORDER — HEPARIN (PORCINE) IN NACL 2-0.9 UNIT/ML-% IJ SOLN
INTRAMUSCULAR | Status: DC | PRN
  Administered 2016-09-25: 1000 mL

## 2016-09-25 MED ORDER — SODIUM CHLORIDE 0.9 % WEIGHT BASED INFUSION: 1.0000 mL/kg/h | INTRAVENOUS | Status: DC

## 2016-09-25 MED ORDER — LIDOCAINE HCL (PF) 1 % IJ SOLN
INTRAMUSCULAR | Status: AC
  Filled 2016-09-25: qty 30

## 2016-09-25 SURGICAL SUPPLY — 17 items
CATH EXPO 5F FL3.5 (CATHETERS) ×2 IMPLANT
CATH EXPO 5FR ANG PIGTAIL 145 (CATHETERS) ×2 IMPLANT
CATH MICROCATH NAVVUS (MICROCATHETER) ×1 IMPLANT
CATH OPTITORQUE TIG 4.0 5F (CATHETERS) ×2 IMPLANT
CATH VISTA GUIDE 6FR XBLAD3.5 (CATHETERS) ×2 IMPLANT
DEVICE RAD COMP TR BAND LRG (VASCULAR PRODUCTS) ×2 IMPLANT
GLIDESHEATH SLEND SS 6F .021 (SHEATH) ×2 IMPLANT
GUIDEWIRE INQWIRE 1.5J.035X260 (WIRE) ×1 IMPLANT
INQWIRE 1.5J .035X260CM (WIRE) ×2
KIT ESSENTIALS PG (KITS) ×2 IMPLANT
KIT HEART LEFT (KITS) ×2 IMPLANT
MICROCATHETER NAVVUS (MICROCATHETER) ×2
PACK CARDIAC CATHETERIZATION (CUSTOM PROCEDURE TRAY) ×2 IMPLANT
SYR MEDRAD MARK V 150ML (SYRINGE) ×2 IMPLANT
TRANSDUCER W/STOPCOCK (MISCELLANEOUS) ×2 IMPLANT
TUBING CIL FLEX 10 FLL-RA (TUBING) ×2 IMPLANT
WIRE ASAHI PROWATER 180CM (WIRE) ×4 IMPLANT

## 2016-09-25 NOTE — Interval H&P Note (Signed)
Cath Lab Visit (complete for each Cath Lab visit)  Clinical Evaluation Leading to the Procedure:   ACS: No.  Non-ACS:    Anginal Classification: CCS III  Anti-ischemic medical therapy: Maximal Therapy (2 or more classes of medications)  Non-Invasive Test Results: No non-invasive testing performed  Prior CABG: No previous CABG      History and Physical Interval Note:  09/25/2016 1:51 PM  Jesse Page  has presented today for surgery, with the diagnosis of abnormal stress test, angina  The various methods of treatment have been discussed with the patient and family. After consideration of risks, benefits and other options for treatment, the patient has consented to  Procedure(s): Left Heart Cath and Coronary Angiography (N/A) as a surgical intervention .  The patient's history has been reviewed, patient examined, no change in status, stable for surgery.  I have reviewed the patient's chart and labs.  Questions were answered to the patient's satisfaction.     Jesse Page

## 2016-09-25 NOTE — H&P (View-Only) (Signed)
Cardiology Office Note   Date:  09/24/2016   ID:  Jesse Page, DOB 09/08/1960, MRN [id number]  PCP:  Carmin RichmondAVIS,JAMES W, MD  Cardiologist:   Chilton Siiffany Ghent, MD   Chief Complaint  Patient presents with  . New Patient (Initial Visit)  . Shortness of Breath    with exertion  . Chest Pain    with exertion.        History of Present Illness: Jesse Page is a 56 y.o. male with CAD s/p PCI, hypertension, hyperlipidemia, tobacco abuse and moderate carotid stenosis who presents for follow up. Jesse Page was previously a patient of Dr. Shirlee LatchMcLean.  He was admitted 07/2009 with unstable angina.  LHCshowed significant mid RCA stenosis and apical LAD stenosis. He underwent PCI with Xience DES to the mid RCA. LHC in 8/11 (recurrent CP and + CP on treadmill for Nuc study)showed moderate, non-flow limiting disease in the LAD and mild in-stent restenosis in the RCA.  This was managed medically.  He developed recurrent chest pain episodes in the summer of 2012. Lexiscan myoview in 7/12 showed EF 54%, no ischemia or infarction. He had recurrent symptoms and underwent LHC in 9/15showed 90% distal LAD stenosis (stable from past) and 70% ostial ramus stenosis. FFR was done on the ramus lesion, result was 0.86 (not significant). He was treated medically. He had response to isosorbide but could not tolerate this due to headache.  [facility] that demonstrated a patent RCA stent and 80% distal LAD stenosis.  Ranexa was added to his regimen.  He continued to have exertional angina so amlodipine was added to his regimen and he was instructed to take Ranexa bid as prescribed.  Nuclear stress testing 03/22/16 showed LVEF 45% with basal to mid inferior infarct with peri-infarct ischemia, which was new.  Given that his symptoms were improved he elected not to pursue recurrent LHC.  Jesse Page also had a 48 hour Holter 03/22/16 that showed occasional PVCs and three short runs of SVT. His heart  rate was too low to titrate his beta blocker.  He notes that his PVCs are getting worse.  Two weeks ago he had PVCs that lasted over an hour.  [facility] and noted that he had intermittent PVCs while there but no sustained arrhythmias.  He reports that his electrolytes were normal.  They always occur after eating and sometimes when he is laying down.  For the last 6-7 weeks he also notes intense heartburn.  He has been taking Zantac daily.  He hasn't been exercising because he gets chest pain and shortness of breath with exertion.  He denies lower extremity edema, orthopnea or PND.  He continues to smoke 1ppd.  He has tried Wellbutrin and Chantix but didn't like how they made him feel.  He tried patches which helped somewhat.   Past Medical History:  Diagnosis Date  . CAD (coronary artery disease)    a. [place] 12[place] >> s/p Xience DES to mRCA.// b. LHC 8/11 - patent RCA stent, stable anatomy  //  c. LHC 9/15 -  90% distal LAD stenosis (stable from past) and 70% ostial ramus stenosis.  FFR was done on the ramus lesion, result was 0.86 (not significant). - med Rx. //  d. LHC 9/16 - pLAD 30, mLAD 30, dLAD diff 80 (small), oRI 50-60 (unchanged), mRCA stent ok with 30% ISR, dRCA 30, EF 60% - med Rx  . Carotid artery disease (HCC)    A. Carotid  US 10/15: R 40-59%, L 1-39% >> FU 1 year  . Depression   . History of echocardiogram    a. Echo (2/11): EF 55-60%, normal wall motion, no significant valvular abnormalities.  . History of nuclear stress test    a. Myoview 7/12 - Overall Impression:  Normal stress nuclear study.  No evidence of ischemia.  Normal LV function. //  b. Myoview 8/17: inf infarct with mild peri-infarct ischemia, EF 45%; Low Risk  . History of pulmonary embolism 2009   hx of segmental Rt lower lobe acute pulmonary emboli w/no evidence of rt heart strain and overall small clot burden. in 2009. pt no lnger taking coumadin   . HLD (hyperlipidemia)   . HTN (hypertension)   . Palpitations      PVCs noted in past  //  Holter 8/17: 1. Occasional PVCs. 2.3 short runs of SVT, ?atrial tachycardia.   . Tobacco abuse    quit 12/11 but restarted.  PFTs in 8/13 were normal.  He was unable to tolerate Chantix or wellbutrin.     Past Surgical History:  Procedure Laterality Date  . angioplasty    . CARDIAC CATHETERIZATION N/A 04/19/2015   Procedure: Left Heart Cath and Coronary Angiography;  Surgeon: Laurey Moralealton S McLean, MD;  [facility];  Service: Cardiovascular;  Laterality: N/A;  . HIP SURGERY    . LEFT HEART CATHETERIZATION WITH CORONARY ANGIOGRAM N/A 04/27/2014   Procedure: LEFT HEART CATHETERIZATION WITH CORONARY ANGIOGRAM;  Surgeon: Laurey Moralealton S McLean, MD;  [facility];  Service: Cardiovascular;  Laterality: N/A;     Current Outpatient Prescriptions  Medication Sig Dispense Refill  . amLODipine (NORVASC) 2.5 MG tablet Take 1 tablet (2.5 mg total) by mouth 2 (two) times daily. 180 tablet 3  . aspirin EC 81 MG tablet Take 1 tablet (81 mg total) by mouth daily.    Marland Kitchen. atorvastatin (LIPITOR) 80 MG tablet TAKE 1 TABLET (80 MG TOTAL) BY MOUTH DAILY. 30 tablet 0  . carvedilol (COREG) 6.25 MG tablet TAKE 1 TABLET BY MOUTH 2 TIMES DAILY. 180 tablet 3  . citalopram (CELEXA) 20 MG tablet Take 1 tablet by mouth daily.    . clopidogrel (PLAVIX) 75 MG tablet TAKE 1 TABLET (75 MG TOTAL) BY MOUTH DAILY. 90 tablet 3  . nitroGLYCERIN (NITROSTAT) 0.4 MG SL tablet Place 0.4 mg under the tongue every 5 (five) minutes as needed for chest pain.    Marland Kitchen. RANEXA 1000 MG SR tablet TAKE 1 TABLET BY MOUTH 2 TIMES DAILY. 180 tablet 2  . tamsulosin (FLOMAX) 0.4 MG CAPS capsule TAKE ONE CAPSULE BY MOUTH DAILY    . pantoprazole (PROTONIX) 40 MG tablet Take 1 tablet by mouth twice a day for 2 weeks and then once daily 60 tablet 5   Current Facility-Administered Medications  Medication Dose Route Frequency Provider Last Rate Last Dose  . 0.9 %  sodium chloride infusion   Intravenous Continuous  Laurey Moralealton S McLean, MD        Allergies:   Penicillins    Social History:  The patient  reports that he has been smoking Cigarettes.  He has a 51.00 pack-year smoking history. He has never used smokeless tobacco. He reports that he drinks alcohol. He reports that he does not use drugs.   Family History:  The patient's family history includes Cancer in his paternal grandmother; Coronary artery disease in his father; Emphysema in his mother.    ROS:  Please see the history of  present illness.   Otherwise, review of systems are positive for none.   All other systems are reviewed and negative.    PHYSICAL EXAM: VS:  BP (!) 142/90   Pulse 60   Ht 5\' 11"  (1.803 m)   Wt 90.9 kg (200 lb 6.4 oz)   BMI 27.95 kg/m  , BMI Body mass index is 27.95 kg/m. GENERAL:  Well appearing HEENT:  Pupils equal round and reactive, fundi not visualized, oral mucosa unremarkable NECK:  No jugular venous distention, waveform within normal limits, carotid upstroke brisk and symmetric, no bruits, no thyromegaly LYMPHATICS:  No cervical adenopathy LUNGS:  Clear to auscultation bilaterally HEART:  RRR.  PMI not displaced or sustained,S1 and S2 within normal limits, no S3, no S4, no clicks, no rubs, no murmurs ABD:  Flat, positive bowel sounds normal in frequency in pitch, no bruits, no rebound, no guarding, no midline pulsatile mass, no hepatomegaly, no splenomegaly EXT:  2 plus pulses throughout, no edema, no cyanosis no clubbing SKIN:  No rashes no nodules NEURO:  Cranial nerves II through XII grossly intact, motor grossly intact throughout PSYCH:  Cognitively intact, oriented to person place and time    EKG:  EKG is not ordered today. The ekg ordered today demonstrates sinus rhyhhm.  R Iate 60 bpm.   Lexiscan Myoview 03/22/16:  The left ventricular ejection fraction is mildly decreased (45-54%).  Nuclear stress EF: 45%.  There was no ST segment deviation noted during stress.  Defect 1: There is a  medium defect of moderate severity present in the basal inferior and mid inferior location.  This is a low risk study.   Low risk stress nuclear study with prior inferior infarct and mild peri-infarct ischemia; EF 45 with mild diffuse hypokinesis; mild LVE.  Carotid ultrasound 03/25/16: 1-39% ICA stenosis bilaterally  48 hour Holter 03/22/16: 1. Occasional PVCs.  2.3 short runs of SVT, ?atrial tachycardia.   LHC 04/19/15: 30% mid LAD, 80% distal LAD, 50-60% RI, RCA stent patent with 30% in-stent restenosis, 30% distal RCA  Recent Labs: 03/18/2016: ALT 26 04/01/2016: BUN 14; Creat 0.94; Magnesium 1.9; Potassium 4.1; Sodium 141    Lipid Panel    Component Value Date/Time   CHOL 116 (L) 03/18/2016 1118   TRIG 106 03/18/2016 1118   HDL 39 (L) 03/18/2016 1118   CHOLHDL 3.0 03/18/2016 1118   VLDL 21 03/18/2016 1118   LDLCALC 56 03/18/2016 1118      Wt Readings from Last 3 Encounters:  09/24/16 90.9 kg (200 lb 6.4 oz)  04/01/16 86.2 kg (190 lb 1.9 oz)  03/18/16 86.6 kg (191 lb)      ASSESSMENT AND PLAN:  # Stable angina: # PVCs: Mr. Sangalang had a Lexiscan Myoview that Showed prior infarct with peri-infarct ischemia in the inferior region. He continues to have angina with exertion despite Ranexa, carvedilol and amlodipine.  We will arrange for LHC to assess.  He has known distal LAD disease that has not been amenable to PCI.  Continue aspirin, clopidogrel, and atorvastatin.  # Hypertension: BP elevated today.  Repeat 138/74.  He will keep a log of his blood pressure and bring it to follow up.  Continue amlodipine and carvedilol.    # Hyperlipidemia: LDL 56.  Continue atorvastatin.  Current medicines are reviewed at length with the patient today.  The patient does not have concerns regarding medicines.  The following changes have been made:  no change  Labs/ tests ordered today include:   Orders  Placed This Encounter  Procedures  . CBC with Differential/Platelet  . Basic  metabolic panel  . INR/PT  . EKG 12-Lead     Disposition:   FU with Emmersyn Kratzke C. Duke Salvia, MD, [facility] in 1 month    This note was written with the assistance of speech recognition software.  Please excuse any transcriptional errors.  Signed, Asiah Browder C. Duke Salvia, MD, [facility]  09/24/2016 1:17 PM    [facility]

## 2016-09-25 NOTE — Discharge Instructions (Signed)

## 2016-09-26 ENCOUNTER — Encounter (HOSPITAL_COMMUNITY): Payer: Self-pay | Admitting: Cardiovascular Disease

## 2016-09-26 MED FILL — Lidocaine HCl Local Preservative Free (PF) Inj 1%: INTRAMUSCULAR | Qty: 30 | Status: AC

## 2016-09-26 MED FILL — Verapamil HCl IV Soln 2.5 MG/ML: INTRAVENOUS | Qty: 2 | Status: AC

## 2016-10-13 ENCOUNTER — Other Ambulatory Visit: Payer: Self-pay | Admitting: Cardiology

## 2016-10-14 NOTE — Telephone Encounter (Signed)
Rx has been sent to the pharmacy electronically. ° °

## 2016-10-28 ENCOUNTER — Ambulatory Visit (INDEPENDENT_AMBULATORY_CARE_PROVIDER_SITE_OTHER): Payer: BLUE CROSS/BLUE SHIELD | Admitting: Cardiovascular Disease

## 2016-10-28 ENCOUNTER — Encounter: Payer: Self-pay | Admitting: Cardiovascular Disease

## 2016-10-28 VITALS — BP 127/75 | HR 63 | Ht 71.0 in | Wt 199.0 lb

## 2016-10-28 DIAGNOSIS — I25119 Atherosclerotic heart disease of native coronary artery with unspecified angina pectoris: Secondary | ICD-10-CM | POA: Diagnosis not present

## 2016-10-28 DIAGNOSIS — E7800 Pure hypercholesterolemia, unspecified: Secondary | ICD-10-CM | POA: Diagnosis not present

## 2016-10-28 DIAGNOSIS — I119 Hypertensive heart disease without heart failure: Secondary | ICD-10-CM | POA: Diagnosis not present

## 2016-10-28 DIAGNOSIS — R0602 Shortness of breath: Secondary | ICD-10-CM | POA: Diagnosis not present

## 2016-10-28 DIAGNOSIS — I493 Ventricular premature depolarization: Secondary | ICD-10-CM | POA: Diagnosis not present

## 2016-10-28 DIAGNOSIS — R300 Dysuria: Secondary | ICD-10-CM

## 2016-10-28 DIAGNOSIS — F17200 Nicotine dependence, unspecified, uncomplicated: Secondary | ICD-10-CM

## 2016-10-28 LAB — URINALYSIS
Bilirubin Urine: NEGATIVE
Glucose, UA: NEGATIVE
Hgb urine dipstick: NEGATIVE
Ketones, ur: NEGATIVE
NITRITE: POSITIVE — AB
PH: 5.5 (ref 5.0–8.0)
PROTEIN: NEGATIVE
SPECIFIC GRAVITY, URINE: 1.026 (ref 1.001–1.035)

## 2016-10-28 NOTE — Patient Instructions (Addendum)
Medication Instructions:  Your physician recommends that you continue on your current medications as directed. Please refer to the Current Medication list given to you today.  Labwork: Your physician recommends that you return for lab to complete a Urinalysis  Testing/Procedures: Your physician has requested that you have an echocardiogram. Echocardiography is a painless test that uses sound waves to create images of your heart. It provides your doctor with information about the size and shape of your heart and how well your heart's chambers and valves are working. This procedure takes approximately one hour. There are no restrictions for this procedure.  Your physician has recommended that you have a pulmonary function test. Pulmonary Function Tests are a group of tests that measure how well air moves in and out of your lungs. If [facility] is too far you can go elsewhere we can send your referral anywhere. You can also Google some places near you sometimes the [employer] offer classes.   You have been referred to Cardiac Rehab.  [place] and would like for tests to be completed near his home, explained to patient unsure if this is possible   Follow-Up: Your physician wants you to follow-up in: 6 Months with Dr Duke Salviaandolph. You will receive a reminder letter in the mail two months in advance. If you don't receive a letter, please call our office to schedule the follow-up appointment.  Any Other Special Instructions Will Be Listed Below (If Applicable).   If you need a refill on your cardiac medications before your next appointment, please call your pharmacy.

## 2016-10-28 NOTE — Progress Notes (Signed)
Cardiology Office Note   Date:  10/28/2016   ID:  Jesse Page, DOB 10-20-1960, MRN [id number]  PCP:  Carmin Richmond, MD  Cardiologist:   Chilton Si, MD    Chief Complaint  Patient presents with  . Follow-up    1 month;       History of Present Illness: Jesse Page is a 56 y.o. male with CAD s/p PCI, hypertension, hyperlipidemia, tobacco abuse and moderate carotid stenosis who presents for follow up. Mr. Jesse Page was previously a patient of Dr. Shirlee Latch.  He was admitted 07/2009 with unstable angina.  LHCshowed significant mid RCA stenosis and apical LAD stenosis. He underwent PCI with Xience DES to the mid RCA. LHC in 8/11 (recurrent CP and + CP on treadmill for Nuc study)showed moderate, non-flow limiting disease in the LAD and mild in-stent restenosis in the RCA.  This was managed medically.  He developed recurrent chest pain episodes in the summer of 2012. Lexiscan myoview in 7/12 showed EF 54%, no ischemia or infarction. He had recurrent symptoms and underwent LHC in 9/15showed 90% distal LAD stenosis (stable from past) and 70% ostial ramus stenosis. FFR was done on the ramus lesion, result was 0.86 (not significant). He was treated medically. He had response to isosorbide but could not tolerate this due to headache.  He had another [facility] 04/2015 that demonstrated a patent RCA stent and 80% distal LAD stenosis.  Ranexa was added to his regimen.  He continued to have exertional angina so amlodipine was added to his regimen and he was instructed to take Ranexa bid as prescribed.  Nuclear stress testing 03/22/16 showed LVEF 45% with basal to mid inferior infarct with peri-infarct ischemia, which was new.  He had a LHC 09/25/16 that showed a 60% ramus intermedius lesion, 40% mid LAD, diffuse 85% distal LAD, and 25-30% RCA disease.  FFR was performed on the RI and it was not significant (0.93) medical therapy was recommended and amlodipine was increased from 2.5 mg to 5 mg  daily.  Mr. Ratterman also had a 48 hour Holter 03/22/16 that showed occasional PVCs and three short runs of SVT.   He has been feeling well but does note exertional shortness of breath.  He denies chest pain.  Since his last appointment he has been taking his medication more regularly. He notes that his palpitations have been much better-controlled. He stopped taking Protonix and does not have any GERD.  He also complains of foul-smelling urine area he denies dysuria or hematuria. His main complaint today is exertional shortness of breath. He continues to smoke one pack of cigarettes daily.  He has tried Wellbutrin and Chantix but didn't like how they made him feel.  He tried patches which helped somewhat.   Past Medical History:  Diagnosis Date  . CAD (coronary artery disease)    a. [place] 12/10 >> s/p Xience DES to mRCA.// b. LHC 8/11 - patent RCA stent, stable anatomy  //  c. LHC 9/15 -  90% distal LAD stenosis (stable from past) and 70% ostial ramus stenosis.  FFR was done on the ramus lesion, result was 0.86 (not significant). - med Rx. //  d. LHC 9/16 - pLAD 30, mLAD 30, dLAD diff 80 (small), oRI 50-60 (unchanged), mRCA stent ok with 30% ISR, dRCA 30, EF 60% - med Rx  . Carotid artery disease (HCC)    A. Carotid US 10/15: R 40-59%, L 1-39% >> FU 1 year  . Depression   . History  of echocardiogram    a. Echo (2/11): EF 55-60%, normal wall motion, no significant valvular abnormalities.  . History of nuclear stress test    a. Myoview 7/12 - Overall Impression:  Normal stress nuclear study.  No evidence of ischemia.  Normal LV function. //  b. Myoview 8/17: inf infarct with mild peri-infarct ischemia, EF 45%; Low Risk  . History of pulmonary embolism 2009   hx of segmental Rt lower lobe acute pulmonary emboli w/no evidence of rt heart strain and overall small clot burden. in 2009. pt no lnger taking coumadin   . HLD (hyperlipidemia)   . HTN (hypertension)   . Palpitations    PVCs noted in past  //   Holter 8/17: 1. Occasional PVCs. 2.3 short runs of SVT, ?atrial tachycardia.   . Tobacco abuse    quit 12/11 but restarted.  PFTs in 8/13 were normal.  He was unable to tolerate Chantix or wellbutrin.     Past Surgical History:  Procedure Laterality Date  . angioplasty    . CARDIAC CATHETERIZATION N/A 04/19/2015   Procedure: Left Heart Cath and Coronary Angiography;  Surgeon: Laurey Morale, MD;  [facility] CV LAB;  Service: Cardiovascular;  Laterality: N/A;  . HIP SURGERY    . INTRAVASCULAR PRESSURE WIRE/FFR STUDY N/A 09/25/2016   Procedure: Intravascular Pressure Wire/FFR Study;  Surgeon: Lennette Bihari, MD;  Location: [facility] INVASIVE CV LAB;  Service: Cardiovascular;  Laterality: N/A;  . LEFT HEART CATH AND CORONARY ANGIOGRAPHY N/A 09/25/2016   Procedure: Left Heart Cath and Coronary Angiography;  Surgeon: Lennette Bihari, MD;  Location: [facility] INVASIVE CV LAB;  Service: Cardiovascular;  Laterality: N/A;  . LEFT HEART CATHETERIZATION WITH CORONARY ANGIOGRAM N/A 04/27/2014   Procedure: LEFT HEART CATHETERIZATION WITH CORONARY ANGIOGRAM;  Surgeon: Laurey Morale, MD;  Location: [facility] CATH LAB;  Service: Cardiovascular;  Laterality: N/A;     Current Outpatient Prescriptions  Medication Sig Dispense Refill  . amLODipine (NORVASC) 2.5 MG tablet Take 1 tablet (2.5 mg total) by mouth 2 (two) times daily. 180 tablet 3  . aspirin EC 81 MG tablet Take 1 tablet (81 mg total) by mouth daily.    Marland Kitchen atorvastatin (LIPITOR) 80 MG tablet TAKE 1 TABLET (80 MG TOTAL) BY MOUTH DAILY. 90 tablet 3  . carvedilol (COREG) 6.25 MG tablet TAKE 1 TABLET BY MOUTH 2 TIMES DAILY. 180 tablet 3  . citalopram (CELEXA) 20 MG tablet Take 1 tablet by mouth daily.    . clopidogrel (PLAVIX) 75 MG tablet TAKE 1 TABLET (75 MG TOTAL) BY MOUTH DAILY. 90 tablet 3  . nitroGLYCERIN (NITROSTAT) 0.4 MG SL tablet Place 0.4 mg under the tongue every 5 (five) minutes as needed for chest pain.    . pantoprazole (PROTONIX) 40 MG tablet Take  1 tablet by mouth twice a day for 2 weeks and then once daily 60 tablet 5  . RANEXA 1000 MG SR tablet TAKE 1 TABLET BY MOUTH 2 TIMES DAILY. 180 tablet 2  . ranitidine (ZANTAC) 150 MG tablet Take 150 mg by mouth 2 (two) times daily.    . tamsulosin (FLOMAX) 0.4 MG CAPS capsule TAKE ONE CAPSULE BY MOUTH DAILY     Current Facility-Administered Medications  Medication Dose Route Frequency Provider Last Rate Last Dose  . 0.9 %  sodium chloride infusion   Intravenous Continuous Laurey Morale, MD        Allergies:   Penicillins    Social History:  The patient  reports that he has been smoking Cigarettes.  He has a 51.00 pack-year smoking history. He has never used smokeless tobacco. He reports that he drinks alcohol. He reports that he does not use drugs.   Family History:  The patient's family history includes Cancer in his paternal grandmother; Coronary artery disease in his father; Emphysema in his mother.    ROS:  Please see the history of present illness.   Otherwise, review of systems are positive for none.   All other systems are reviewed and negative.    PHYSICAL EXAM: VS:  BP 127/75   Pulse 63   Ht 5\' 11"  (1.803 m)   Wt 90.3 kg (199 lb)   BMI 27.75 kg/m  , BMI Body mass index is 27.75 kg/m. GENERAL:  Well appearing HEENT:  Pupils equal round and reactive, fundi not visualized, oral mucosa unremarkable NECK:  No jugular venous distention, waveform within normal limits, carotid upstroke brisk and symmetric, no bruits LYMPHATICS:  No cervical adenopathy LUNGS:  Clear to auscultation bilaterally HEART:  RRR.  PMI not displaced or sustained,S1 and S2 within normal limits, no S3, no S4, no clicks, no rubs, no murmurs ABD:  Flat, positive bowel sounds normal in frequency in pitch, no bruits, no rebound, no guarding, no midline pulsatile mass, no hepatomegaly, no splenomegaly EXT:  2 plus pulses throughout, no edema, no cyanosis no clubbing SKIN:  No rashes no nodules NEURO:   Cranial nerves II through XII grossly intact, motor grossly intact throughout PSYCH:  Cognitively intact, oriented to person place and time   EKG:  EKG is not ordered today. The ekg ordered today demonstrates sinus rhyhhm.  R Iate 60 bpm.   Lexiscan Myoview 03/22/16:  The left ventricular ejection fraction is mildly decreased (45-54%).  Nuclear stress EF: 45%.  There was no ST segment deviation noted during stress.  Defect 1: There is a medium defect of moderate severity present in the basal inferior and mid inferior location.  This is a low risk study.   Low risk stress nuclear study with prior inferior infarct and mild peri-infarct ischemia; EF 45 with mild diffuse hypokinesis; mild LVE.  Carotid ultrasound 03/25/16: 1-39% ICA stenosis bilaterally  48 hour Holter 03/22/16: 1. Occasional PVCs.  2.3 short runs of SVT, ?atrial tachycardia.   LHC 04/19/15: 30% mid LAD, 80% distal LAD, 50-60% RI, RCA stent patent with 30% in-stent restenosis, 30% distal RCA  LHC 09/25/16:  [place] lesion, 60 %stenosed.  Mid LAD-2 lesion, 40 %stenosed.  Mid LAD-1 lesion, 40 %stenosed.  Dist LAD-2 lesion, 85 %stenosed.  Dist LAD-1 lesion, 40 %stenosed.  Mid RCA-1 lesion, 30 %stenosed.  Mid RCA-2 lesion, 25 %stenosed.  Dist RCA lesion, 20 %stenosed.  There is mild left ventricular systolic dysfunction.  LV end diastolic pressure is normal.  Recent Labs: 03/18/2016: ALT 26 04/01/2016: Magnesium 1.9 09/24/2016: BUN 15; Creat 1.32; Hemoglobin 17.6; Platelets 229; Potassium 4.9; Sodium 138    Lipid Panel    Component Value Date/Time   CHOL 116 (L) 03/18/2016 1118   TRIG 106 03/18/2016 1118   HDL 39 (L) 03/18/2016 1118   CHOLHDL 3.0 03/18/2016 1118   VLDL 21 03/18/2016 1118   LDLCALC 56 03/18/2016 1118      Wt Readings from Last 3 Encounters:  10/28/16 90.3 kg (199 lb)  09/25/16 90.7 kg (200 lb)  09/24/16 90.9 kg (200 lb 6.4 oz)      ASSESSMENT AND PLAN:  # Stable angina: Mr.  Marin ShutterBilodeau had a [place]  Myov[place] that showed prior infarct with peri-infarct ischemia in the inferior region. Cath showed LVEF 45-50% with severe distal LAD disease not amenable to PCI and RI was not significant.  Continue medical management with aspirin, clopidogrel, amlodipine, carvedilol, Ranexa and atorvastatin. Encourage smoking cessation. We'll refer to cardiac rehabilitation.  # Shortness of breath: Likely multifactorial from ischemia, deconditioning, and tobacco abuse. We will check PFTs and an echocardiogram. He does not appear to be volume overloaded on exam today.  # PVCs: Better-controlled since he has been taking his beta blocker.  # Hypertension: BP controlled.  Continue amlodipine and carvedilol.    # Hyperlipidemia: LDL 56.  Continue atorvastatin.  # Tobacco abuse: Recommended re-trying patches starting with 21 mg.  We discussed smoking cessation for 5 minutes  Current medicines are reviewed at length with the patient today.  The patient does not have concerns regarding medicines.  The following changes have been made:  no change  Labs/ tests ordered today include:   Orders Placed This Encounter  Procedures  . Urinalysis  . Amb Referral to Cardiac Rehabilitation  . ECHOCARDIOGRAM COMPLETE  . Pulmonary function test     Disposition:   FU with Jewel Venditto C. Duke Salvia, MD, [facility] in 6 months.   This note was written with the assistance of speech recognition software.  Please excuse any transcriptional errors.  Signed, Shinita Mac C. Duke Salvia, MD, [facility]  10/28/2016 9:53 AM    [facility]

## 2016-10-30 ENCOUNTER — Other Ambulatory Visit: Payer: Self-pay | Admitting: *Deleted

## 2016-10-30 MED ORDER — CIPROFLOXACIN HCL 500 MG PO TABS: 500.0000 mg | ORAL_TABLET | Freq: Two times a day (BID) | ORAL | 0 refills | Status: DC

## 2016-11-12 ENCOUNTER — Ambulatory Visit (HOSPITAL_COMMUNITY): Payer: BLUE CROSS/BLUE SHIELD | Attending: Cardiology

## 2016-11-12 ENCOUNTER — Other Ambulatory Visit: Payer: Self-pay

## 2016-11-12 DIAGNOSIS — R0602 Shortness of breath: Secondary | ICD-10-CM | POA: Diagnosis not present

## 2016-11-12 DIAGNOSIS — I2510 Atherosclerotic heart disease of native coronary artery without angina pectoris: Secondary | ICD-10-CM | POA: Diagnosis not present

## 2016-11-12 DIAGNOSIS — E785 Hyperlipidemia, unspecified: Secondary | ICD-10-CM | POA: Insufficient documentation

## 2016-11-15 ENCOUNTER — Ambulatory Visit (INDEPENDENT_AMBULATORY_CARE_PROVIDER_SITE_OTHER): Payer: BLUE CROSS/BLUE SHIELD | Admitting: Internal Medicine

## 2016-11-15 DIAGNOSIS — R0602 Shortness of breath: Secondary | ICD-10-CM | POA: Diagnosis not present

## 2016-11-15 LAB — PULMONARY FUNCTION TEST
DL/VA % PRED: 73 %
DL/VA: 3.46 ml/min/mmHg/L
DLCO UNC: 23.49 ml/min/mmHg
DLCO unc % pred: 69 %
FEF 25-75 POST: 3.18 L/s
FEF 25-75 Pre: 3 L/sec
FEF2575-%Change-Post: 5 %
FEF2575-%PRED-PRE: 93 %
FEF2575-%Pred-Post: 98 %
FEV1-%Change-Post: 2 %
FEV1-%PRED-PRE: 84 %
FEV1-%Pred-Post: 86 %
FEV1-Post: 3.33 L
FEV1-Pre: 3.24 L
FEV1FVC-%Change-Post: -1 %
FEV1FVC-%PRED-PRE: 103 %
FEV6-%CHANGE-POST: 3 %
FEV6-%PRED-POST: 88 %
FEV6-%PRED-PRE: 85 %
FEV6-POST: 4.25 L
FEV6-Pre: 4.11 L
FEV6FVC-%CHANGE-POST: 0 %
FEV6FVC-%PRED-POST: 102 %
FEV6FVC-%Pred-Pre: 103 %
FVC-%Change-Post: 4 %
FVC-%PRED-POST: 85 %
FVC-%PRED-PRE: 82 %
FVC-POST: 4.31 L
FVC-PRE: 4.14 L
POST FEV6/FVC RATIO: 99 %
PRE FEV1/FVC RATIO: 78 %
Post FEV1/FVC ratio: 77 %
Pre FEV6/FVC Ratio: 99 %

## 2016-11-15 NOTE — Progress Notes (Signed)
PFT done today. Katie Welchel,CMA  

## 2016-11-28 ENCOUNTER — Telehealth: Payer: Self-pay | Admitting: *Deleted

## 2016-11-28 DIAGNOSIS — R942 Abnormal results of pulmonary function studies: Secondary | ICD-10-CM

## 2016-11-28 NOTE — Telephone Encounter (Signed)
Left message to call back  

## 2016-11-28 NOTE — Telephone Encounter (Signed)
-----   Message from Chilton Si, MD sent at 11/26/2016  1:53 PM EDT ----- PFTs are mildly abnormal. Consider referral to pulmonary to discuss if he is willing.

## 2016-11-28 NOTE — Telephone Encounter (Signed)
Routed.

## 2016-11-28 NOTE — Telephone Encounter (Signed)
Follow up    Pt is returning call to melinda about results.

## 2016-11-28 NOTE — Telephone Encounter (Signed)
Advised patient, verbalized understanding Referral placed for pulmonary

## 2016-12-02 ENCOUNTER — Encounter: Payer: Self-pay | Admitting: Internal Medicine

## 2016-12-02 ENCOUNTER — Ambulatory Visit (INDEPENDENT_AMBULATORY_CARE_PROVIDER_SITE_OTHER): Payer: BLUE CROSS/BLUE SHIELD | Admitting: Internal Medicine

## 2016-12-02 VITALS — BP 130/76 | HR 66 | Ht 71.0 in | Wt 199.8 lb

## 2016-12-02 DIAGNOSIS — F17210 Nicotine dependence, cigarettes, uncomplicated: Secondary | ICD-10-CM

## 2016-12-02 DIAGNOSIS — R0609 Other forms of dyspnea: Secondary | ICD-10-CM | POA: Diagnosis not present

## 2016-12-02 NOTE — Assessment & Plan Note (Signed)

## 2016-12-02 NOTE — Assessment & Plan Note (Signed)
-   Active smoker - PFT's  10/28/2016  FEV1 3.33  (86 % ) ratio 77  p 2  % improvement from saba p nothing prior to study with DLCO  69 % corrects to 7 % for alv volume    He has minimal am cough which is likely related to mucocilicary dysfunction from active smoking but in absence of limiting airflow obstruction or tendency to acute flares there is no benefit from adding bronchodilators either short acting or especially long acting   He does have symptoms that suggest GERD which may explain some of his sob and episodes of cp with no new findings on cath to explain it > advised on gerd rx/ diet   Total time devoted to counseling  > 50 % of initial 60 min office visit:  review case with pt/ discussion of options/alternatives/ personally creating written customized instructions  in presence of pt  then going over those specific  Instructions directly with the pt including how to use all of the meds but in particular covering each new medication in detail and the difference between the maintenance= "automatic" meds and the prns using an action plan format for the latter (If this problem/symptom => do that organization reading Left to right).  Please see AVS from this visit for a full list of these instructions which I personally wrote for this pt and  are unique to this visit.

## 2016-12-02 NOTE — Patient Instructions (Addendum)
Change Protonix  To where you  Take it 30-60 min before first meal of the day   GERD (REFLUX)  is an extremely common cause of respiratory symptoms just like yours , many times with no obvious heartburn at all.    It can be treated with medication, but also with lifestyle changes including elevation of the head of your bed (ideally with 6 inch  bed blocks),  Smoking cessation, avoidance of late meals, excessive alcohol, and avoid fatty foods, chocolate, peppermint, colas, red wine, and acidic juices such as orange juice.  NO MINT OR MENTHOL PRODUCTS SO NO COUGH DROPS   USE SUGARLESS CANDY INSTEAD (Jolley ranchers or Stover's or Life Savers) or even ice chips will also do - the key is to swallow to prevent all throat clearing. NO OIL BASED VITAMINS - use powdered substitutes.   The key is to stop smoking completely before smoking completely stops you!    Pulmonary follow up is needed

## 2016-12-02 NOTE — Progress Notes (Signed)
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    12/02/2016 1st [facility] office visit/ Shaft Corigliano   Chief Complaint  Patient presents with  . Pulmonary Consult    Referred by Dr. Chilton Si for eval of abnormal PFT's. Pt c/o DOE for the past several months.   doe = MMRC1 = can walk nl pace, flat grade, can't hurry or go uphills or steps s sob   Now doing rehab and walking treadmill x 45 min s stopping s desaturating  Some mucus each am p first cig x 10 min slt  yellow  Assoc with overt HB better on rx   No obvious day to day or daytime variability or assoc  mucus plugs or hemoptysis or cp or chest tightness, subjective wheeze or overt sinus symptoms. No unusual exp hx or h/o childhood pna/ asthma or knowledge of premature birth.  Sleeping ok without nocturnal  or early am exacerbation  of respiratory  c/o's or need for noct saba. Also denies any obvious fluctuation of symptoms with weather or environmental changes or other aggravating or alleviating factors except as outlined above   Current Medications, Allergies, Complete Past Medical History, Past Surgical History, Family History, and Social History were reviewed in [employer] record.  ROS  The following are not active complaints unless bolded sore throat, dysphagia, dental problems, itching, sneezing,  nasal congestion or excess/ purulent secretions, ear ache,   fever, chills, sweats, unintended wt loss, classically pleuritic or exertional cp,  orthopnea pnd or leg swelling, presyncope, palpitations, abdominal pain, anorexia, nausea, vomiting, diarrhea  or change in bowel or bladder habits, change in stools or urine,  dysuria,hematuria,  rash, arthralgias, visual complaints, headache, numbness, weakness or ataxia or problems with walking or coordination,  change in mood/affect or memory.          Review of Systems     Objective:   Physical Exam    amb wm nad  Wt Readings from Last 3 Encounters:  12/02/16 199 lb 12.8 oz (90.6 kg)  10/28/16 199 lb (90.3 kg)  09/25/16 200 lb (90.7 kg)    Vital signs reviewed - Note on arrival 02 sats  97% on RA    HEENT: nl dentition,   and oropharynx. Nl external ear canals without cough reflex - moderate bilateral  R> L  non-specific turbinate edema     NECK :  without JVD/Nodes/TM/ nl carotid upstrokes bilaterally   LUNGS: no acc muscle use,  Nl contour chest which is clear to A and P bilaterally without cough on insp or exp maneuvers   CV:  RRR  no s3 or murmur or increase in P2, and no edema   ABD:  soft and nontender with nl inspiratory excursion in the supine position. No bruits or organomegaly appreciated, bowel sounds nl  MS:  Nl gait/ ext warm without deformities, calf tenderness, cyanosis or clubbing No obvious joint restrictions   SKIN: warm and dry without lesions    NEURO:  alert, approp, nl sensorium with  no motor or cerebellar deficits apparent.     I personally reviewed images and agree with radiology impression as follows:  CXR:  09/10/16  No radiographic evidence of cardiopulmonary abnormality     Assessment:

## 2017-04-29 ENCOUNTER — Ambulatory Visit: Payer: BLUE CROSS/BLUE SHIELD | Admitting: Cardiovascular Disease

## 2017-04-29 NOTE — Progress Notes (Deleted)
Cardiology Office Note   Date:  04/29/2017   ID:  Jesse Page, DOB 1960/08/29, MRN [id number]  PCP:  Carmin Richmond, MD  Cardiologist:   Chilton Si, MD    No chief complaint on file.      History of Present Illness: Jesse Page is a 56 y.o. male with CAD s/p PCI, hypertension, hyperlipidemia, tobacco abuse and moderate carotid stenosis who presents for follow up. Jesse Page was previously a patient of Dr. Shirlee Latch.  He was admitted 07/2009 with unstable angina.  LHCshowed significant mid RCA stenosis and apical LAD stenosis. He underwent PCI with Xience DES to the mid RCA. LHC in 8/11 (recurrent CP and + CP on treadmill for Nuc study)showed moderate, non-flow limiting disease in the LAD and mild in-stent restenosis in the RCA.  This was managed medically.  He developed recurrent chest pain episodes in the summer of 2012. Lexiscan myoview in 7/12 showed EF 54%, no ischemia or infarction. He had recurrent symptoms and underwent LHC in 9/15showed 90% distal LAD stenosis (stable from past) and 70% ostial ramus stenosis. FFR was done on the ramus lesion, result was 0.86 (not significant). He was treated medically. He had response to isosorbide but could not tolerate this due to headache.  He had another [facility] 04/2015 that demonstrated a patent RCA stent and 80% distal LAD stenosis.  Ranexa was added to his regimen.  He continued to have exertional angina so amlodipine was added to his regimen and he was instructed to take Ranexa bid as prescribed.  Nuclear stress testing 03/22/16 showed LVEF 45% with basal to mid inferior infarct with peri-infarct ischemia, which was new.  He had a LHC 09/25/16 that showed a 60% ramus intermedius lesion, 40% mid LAD, diffuse 85% distal LAD, and 25-30% RCA disease.  FFR was performed on the RI and it was not significant (0.93) medical therapy was recommended and amlodipine was increased from 2.5 mg to 5 mg daily.  Jesse Page also had a 48 hour Holter  03/22/16 that showed occasional PVCs and three short runs of SVT.   He has been feeling well but does note exertional shortness of breath.  He denies chest pain.  Since his last appointment he has been taking his medication more regularly. He notes that his palpitations have been much better-controlled. He stopped taking Protonix and does not have any GERD.  He also complains of foul-smelling urine area he denies dysuria or hematuria. His main complaint today is exertional shortness of breath. He continues to smoke one pack of cigarettes daily.  He has tried Wellbutrin and Chantix but didn't like how they made him feel.  He tried patches which helped somewhat.   F/u tobacco abuse   Past Medical History:  Diagnosis Date  . CAD (coronary artery disease)    a. [place] 12/10 >> s/p Xience DES to mRCA.// b. LHC 8/11 - patent RCA stent, stable anatomy  //  c. LHC 9/15 -  90% distal LAD stenosis (stable from past) and 70% ostial ramus stenosis.  FFR was done on the ramus lesion, result was 0.86 (not significant). - med Rx. //  d. LHC 9/16 - pLAD 30, mLAD 30, dLAD diff 80 (small), oRI 50-60 (unchanged), mRCA stent ok with 30% ISR, dRCA 30, EF 60% - med Rx  . Carotid artery disease (HCC)    A. Carotid US 10/15: R 40-59%, L 1-39% >> FU 1 year  . Depression   . History of echocardiogram  a. Echo (2/11): EF 55-60%, normal wall motion, no significant valvular abnormalities.  . History of nuclear stress test    a. Myoview 7/12 - Overall Impression:  Normal stress nuclear study.  No evidence of ischemia.  Normal LV function. //  b. Myoview 8/17: inf infarct with mild peri-infarct ischemia, EF 45%; Low Risk  . History of pulmonary embolism 2009   hx of segmental Rt lower lobe acute pulmonary emboli w/no evidence of rt heart strain and overall small clot burden. in 2009. pt no lnger taking coumadin   . HLD (hyperlipidemia)   . HTN (hypertension)   . Palpitations    PVCs noted in past  //  Holter 8/17: 1.  Occasional PVCs. 2.3 short runs of SVT, ?atrial tachycardia.   . Tobacco abuse    quit 12/11 but restarted.  PFTs in 8/13 were normal.  He was unable to tolerate Chantix or wellbutrin.     Past Surgical History:  Procedure Laterality Date  . angioplasty    . CARDIAC CATHETERIZATION N/A 04/19/2015   Procedure: Left Heart Cath and Coronary Angiography;  Surgeon: Laurey Morale, MD;  Location[facility]VAS[facility] CV LAB;  Service: Cardiovascular;  Laterality: N/A;  . HIP SURGERY    . INTRAVASCULAR PRESSURE WIRE/FFR STUDY N/A 09/25/2016   Procedure: Intravascular Pressure Wire/FFR Study;  Surgeon: Lennette Bihari, MD;  Location: [facility] INVASIVE CV LAB;  Service: Cardiovascular;  Laterality: N/A;  . LEFT HEART CATH AND CORONARY ANGIOGRAPHY N/A 09/25/2016   Procedure: Left Heart Cath and Coronary Angiography;  Surgeon: Lennette Bihari, MD;  Location: [facility] INVASIVE CV LAB;  Service: Cardiovascular;  Laterality: N/A;  . LEFT HEART CATHETERIZATION WITH CORONARY ANGIOGRAM N/A 04/27/2014   Procedure: LEFT HEART CATHETERIZATION WITH CORONARY ANGIOGRAM;  Surgeon: Laurey Morale, MD;  Location: [facility] CATH LAB;  Service: Cardiovascular;  Laterality: N/A;     Current Outpatient Prescriptions  Medication Sig Dispense Refill  . amLODipine (NORVASC) 2.5 MG tablet Take 1 tablet (2.5 mg total) by mouth 2 (two) times daily. 180 tablet 3  . aspirin EC 81 MG tablet Take 1 tablet (81 mg total) by mouth daily.    Marland Kitchen atorvastatin (LIPITOR) 80 MG tablet TAKE 1 TABLET (80 MG TOTAL) BY MOUTH DAILY. 90 tablet 3  . carvedilol (COREG) 6.25 MG tablet TAKE 1 TABLET BY MOUTH 2 TIMES DAILY. 180 tablet 3  . citalopram (CELEXA) 20 MG tablet Take 1 tablet by mouth daily.    . clopidogrel (PLAVIX) 75 MG tablet TAKE 1 TABLET (75 MG TOTAL) BY MOUTH DAILY. 90 tablet 3  . nitroGLYCERIN (NITROSTAT) 0.4 MG SL tablet Place 0.4 mg under the tongue every 5 (five) minutes as needed for chest pain.    . pantoprazole (PROTONIX) 40 MG tablet Take 1 tablet by  mouth twice a day for 2 weeks and then once daily 60 tablet 5  . RANEXA 1000 MG SR tablet TAKE 1 TABLET BY MOUTH 2 TIMES DAILY. 180 tablet 2   Current Facility-Administered Medications  Medication Dose Route Frequency Provider Last Rate Last Dose  . 0.9 %  sodium chloride infusion   Intravenous Continuous Laurey Morale, MD        Allergies:   Penicillins    Social History:  The patient  reports that he has been smoking Cigarettes.  He has a 40.00 pack-year smoking history. He has never used smokeless tobacco. He reports that he drinks alcohol. He reports that he does not use drugs.   Family History:  The patient's family history includes Cancer in his paternal grandmother; Coronary artery disease in his father; Emphysema in his mother.    ROS:  Please see the history of present illness.   Otherwise, review of systems are positive for none.   All other systems are reviewed and negative.    PHYSICAL EXAM: VS:  There were no vitals taken for this visit. , BMI There is no height or weight on file to calculate BMI. GENERAL:  Well appearing HEENT:  Pupils equal round and reactive, fundi not visualized, oral mucosa unremarkable NECK:  No jugular venous distention, waveform within normal limits, carotid upstroke brisk and symmetric, no bruits LYMPHATICS:  No cervical adenopathy LUNGS:  Clear to auscultation bilaterally HEART:  RRR.  PMI not displaced or sustained,S1 and S2 within normal limits, no S3, no S4, no clicks, no rubs, no murmurs ABD:  Flat, positive bowel sounds normal in frequency in pitch, no bruits, no rebound, no guarding, no midline pulsatile mass, no hepatomegaly, no splenomegaly EXT:  2 plus pulses throughout, no edema, no cyanosis no clubbing SKIN:  No rashes no nodules NEURO:  Cranial nerves II through XII grossly intact, motor grossly intact throughout PSYCH:  Cognitively intact, oriented to person place and time   EKG:  EKG is not ordered today. The ekg ordered  today demonstrates sinus rhyhhm.  R Iate 60 bpm.   Lexiscan Myoview 03/22/16:  The left ventricular ejection fraction is mildly decreased (45-54%).  Nuclear stress EF: 45%.  There was no ST segment deviation noted during stress.  Defect 1: There is a medium defect of moderate severity present in the basal inferior and mid inferior location.  This is a low risk study.   Low risk stress nuclear study with prior inferior infarct and mild peri-infarct ischemia; EF 45 with mild diffuse hypokinesis; mild LVE.  Carotid ultrasound 03/25/16: 1-39% ICA stenosis bilaterally  48 hour Holter 03/22/16: 1. Occasional PVCs.  2.3 short runs of SVT, ?atrial tachycardia.   LHC 04/19/15: 30% mid LAD, 80% distal LAD, 50-60% RI, RCA stent patent with 30% in-stent restenosis, 30% distal RCA  LHC 09/25/16:  Ramus lesion, 60 %stenosed.  Mid LAD-2 lesion, 40 %stenosed.  Mid LAD-1 lesion, 40 %stenosed.  Dist LAD-2 lesion, 85 %stenosed.  Dist LAD-1 lesion, 40 %stenosed.  Mid RCA-1 lesion, 30 %stenosed.  Mid RCA-2 lesion, 25 %stenosed.  Dist RCA lesion, 20 %stenosed.  There is mild left ventricular systolic dysfunction.  LV end diastolic pressure is normal.  Recent Labs: 09/24/2016: BUN 15; Creat 1.32; Hemoglobin 17.6; Platelets 229; Potassium 4.9; Sodium 138    Lipid Panel    Component Value Date/Time   CHOL 116 (L) 03/18/2016 1118   TRIG 106 03/18/2016 1118   HDL 39 (L) 03/18/2016 1118   CHOLHDL 3.0 03/18/2016 1118   VLDL 21 03/18/2016 1118   LDLCALC 56 03/18/2016 1118      Wt Readings from Last 3 Encounters:  12/02/16 90.6 kg (199 lb 12.8 oz)  10/28/16 90.3 kg (199 lb)  09/25/16 90.7 kg (200 lb)      ASSESSMENT AND PLAN:  # Stable angina: Jesse Page had a Lexiscan Myoview that showed prior infarct with peri-infarct ischemia in the inferior region. Cath showed LVEF 45-50% with severe distal LAD disease not amenable to PCI and RI was not significant.  Continue medical  management with aspirin, clopidogrel, amlodipine, carvedilol, Ranexa and atorvastatin. Encourage smoking cessation. We'll refer to cardiac rehabilitation.  # Shortness of breath: Likely multifactorial from ischemia,  deconditioning, and tobacco abuse. We will check PFTs and an echocardiogram. He does not appear to be volume overloaded on exam today.  # PVCs: Better-controlled since he has been taking his beta blocker.  # Hypertension: BP controlled.  Continue amlodipine and carvedilol.    # Hyperlipidemia: LDL 56.  Continue atorvastatin.  # Tobacco abuse: Recommended re-trying patches starting with 21 mg.  We discussed smoking cessation for 5 minutes  Current medicines are reviewed at length with the patient today.  The patient does not have concerns regarding medicines.  The following changes have been made:  no change  Labs/ tests ordered today include:   No orders of the defined types were placed in this encounter.    Disposition:   FU with Mackayla Mullins C. Duke Salvia, MD, [facility] in 6 months.   This note was written with the assistance of speech recognition software.  Please excuse any transcriptional errors.  Signed, Cecia Egge C. Duke Salvia, MD, [facility]  04/29/2017 1:11 PM    [facility]

## 2017-05-13 ENCOUNTER — Encounter: Payer: Self-pay | Admitting: Cardiovascular Disease

## 2017-05-13 ENCOUNTER — Ambulatory Visit (INDEPENDENT_AMBULATORY_CARE_PROVIDER_SITE_OTHER): Payer: BLUE CROSS/BLUE SHIELD | Admitting: Cardiovascular Disease

## 2017-05-13 VITALS — BP 118/68 | HR 67 | Ht 70.5 in | Wt 200.2 lb

## 2017-05-13 DIAGNOSIS — Z5181 Encounter for therapeutic drug level monitoring: Secondary | ICD-10-CM

## 2017-05-13 DIAGNOSIS — E7800 Pure hypercholesterolemia, unspecified: Secondary | ICD-10-CM | POA: Diagnosis not present

## 2017-05-13 DIAGNOSIS — I208 Other forms of angina pectoris: Secondary | ICD-10-CM | POA: Diagnosis not present

## 2017-05-13 DIAGNOSIS — I493 Ventricular premature depolarization: Secondary | ICD-10-CM

## 2017-05-13 DIAGNOSIS — I119 Hypertensive heart disease without heart failure: Secondary | ICD-10-CM | POA: Diagnosis not present

## 2017-05-13 DIAGNOSIS — R0602 Shortness of breath: Secondary | ICD-10-CM

## 2017-05-13 NOTE — Patient Instructions (Signed)
Medication Instructions:  Your physician recommends that you continue on your current medications as directed. Please refer to the Current Medication list given to you today.  Labwork: FASTING LP/HFP SOON AT YOUR PRIMARY CARE  Testing/Procedures: NONE  Follow-Up: Your physician wants you to follow-up in: 6 MONTH OV You will receive a reminder letter in the mail two months in advance. If you don't receive a letter, please call our office to schedule the follow-up appointment.  Any Other Special Instructions Will Be Listed Below (If Applicable). INCREASE YOUR EXERCISE TO 30-40 MINUTES MOST DAYS OF THE WEEK   If you need a refill on your cardiac medications before your next appointment, please call your pharmacy.

## 2017-05-13 NOTE — Progress Notes (Signed)
Cardiology Office Note   Date:  05/13/2017   ID:  Jesse Page, DOB 06-09-1961, MRN [id number]  PCP:  Carmin Richmond, MD  Cardiologist:   Chilton Si, MD    Chief Complaint  Patient presents with  . Follow-up    6 months;        History of Present Illness: Jesse Page is a 56 y.o. male with CAD s/p PCI, hypertension, hyperlipidemia, tobacco abuse and moderate carotid stenosis who presents for follow up. Jesse Page was previously a patient of Dr. Shirlee Latch.  He was admitted 07/2009 with unstable angina.  LHCshowed significant mid RCA stenosis and apical LAD stenosis. He underwent PCI with Xience DES to the mid RCA. LHC in 8/11 (recurrent CP and + CP on treadmill for Nuc study)showed moderate, non-flow limiting disease in the LAD and mild in-stent restenosis in the RCA.  This was managed medically.  He developed recurrent chest pain episodes in the summer of 2012. Lexiscan myoview in 7/12 showed EF 54%, no ischemia or infarction. He had recurrent symptoms and underwent LHC in 9/15showed 90% distal LAD stenosis (stable from past) and 70% ostial ramus stenosis. FFR was done on the ramus lesion, result was 0.86 (not significant). He was treated medically. He had response to isosorbide but could not tolerate this due to headache.  He had another [facility] 04/2015 that demonstrated a patent RCA stent and 80% distal LAD stenosis.  Ranexa was added to his regimen.  He continued to have exertional angina so amlodipine was added to his regimen and he was instructed to take Ranexa bid as prescribed.  Nuclear stress testing 03/22/16 showed LVEF 45% with basal to mid inferior infarct with peri-infarct ischemia, which was new.  He had a LHC 09/25/16 that showed a 60% ramus intermedius lesion, 40% mid LAD, diffuse 85% distal LAD, and 25-30% RCA disease.  FFR was performed on the RI and it was not significant (0.93) medical therapy was recommended and amlodipine was increased from 2.5 mg to 5 mg daily.   Jesse Page also had a 48 hour Holter 03/22/16 that showed occasional PVCs and three short runs of SVT.     At his last appointment Jesse Page was doing well but continued to have exertional dyspnea. He has been seen by Dr. Sherene Page who notes that his shortness of breath may be partially due to GERD.  Jesse Page has been doing well.  He had a bad cold in August and had a hard time recovering.  He know that he has to quit smoking but doesn't feel ready.  His shortness of breath has been worse since his URI in August.  He gets very short of breath with exertion but denies chest pain or pressure.  He continues to have palpitations daily but is fairly used to them.  He sometimes gets lightheaded upon standing but denies syncope.  He also hasn't noted any lower extremity edema, orthopnea or PND.  He hasn't been exercising lately which he attributes to depression and shortness of breath.    Past Medical History:  Diagnosis Date  . CAD (coronary artery disease)    a. [place] 12/10 >> s/p Xience DES to mRCA.// b. LHC 8/11 - patent RCA stent, stable anatomy  //  c. LHC 9/15 -  90% distal LAD stenosis (stable from past) and 70% ostial ramus stenosis.  FFR was done on the ramus lesion, result was 0.86 (not significant). - med Rx. //  d. LHC 9/16 - pLAD 30,  mLAD 30, dLAD diff 80 (small), oRI 50-60 (unchanged), mRCA stent ok with 30% ISR, dRCA 30, EF 60% - med Rx  . Carotid artery disease (HCC)    A. Carotid US 10/15: R 40-59%, L 1-39% >> FU 1 year  . Depression   . History of echocardiogram    a. Echo (2/11): EF 55-60%, normal wall motion, no significant valvular abnormalities.  . History of nuclear stress test    a. Myoview 7/12 - Overall Impression:  Normal stress nuclear study.  No evidence of ischemia.  Normal LV function. //  b. Myoview 8/17: inf infarct with mild peri-infarct ischemia, EF 45%; Low Risk  . History of pulmonary embolism 2009   hx of segmental Rt lower lobe acute pulmonary emboli w/no evidence  of rt heart strain and overall small clot burden. in 2009. pt no lnger taking coumadin   . HLD (hyperlipidemia)   . HTN (hypertension)   . Palpitations    PVCs noted in past  //  Holter 8/17: 1. Occasional PVCs. 2.3 short runs of SVT, ?atrial tachycardia.   . Tobacco abuse    quit 12/11 but restarted.  PFTs in 8/13 were normal.  He was unable to tolerate Chantix or wellbutrin.     Past Surgical History:  Procedure Laterality Date  . angioplasty    . CARDIAC CATHETERIZATION N/A 04/19/2015   Procedure: Left Heart Cath and Coronary Angiography;  Surgeon: Laurey Morale, MD;  Location: [facility] INVASIVE CV LAB;  Service: Cardiovascular;  Laterality: N/A;  . HIP SURGERY    . INTRAVASCULAR PRESSURE WIRE/FFR STUDY N/A 09/25/2016   Procedure: Intravascular Pressure Wire/FFR Study;  Surgeon: Lennette Bihari, MD;  Location: [facility] INVASIVE CV LAB;  Service: Cardiovascular;  Laterality: N/A;  . LEFT HEART CATH AND CORONARY ANGIOGRAPHY N/A 09/25/2016   Procedure: Left Heart Cath and Coronary Angiography;  Surgeon: Lennette Bihari, MD;  Location: [facility] INVASIVE CV LAB;  Service: Cardiovascular;  Laterality: N/A;  . LEFT HEART CATHETERIZATION WITH CORONARY ANGIOGRAM N/A 04/27/2014   Procedure: LEFT HEART CATHETERIZATION WITH CORONARY ANGIOGRAM;  Surgeon: Laurey Morale, MD;  Location: [facility] CATH LAB;  Service: Cardiovascular;  Laterality: N/A;     Current Outpatient Prescriptions  Medication Sig Dispense Refill  . amLODipine (NORVASC) 2.5 MG tablet Take 1 tablet (2.5 mg total) by mouth 2 (two) times daily. 180 tablet 3  . aspirin EC 81 MG tablet Take 1 tablet (81 mg total) by mouth daily.    Marland Kitchen atorvastatin (LIPITOR) 80 MG tablet TAKE 1 TABLET (80 MG TOTAL) BY MOUTH DAILY. 90 tablet 3  . carvedilol (COREG) 6.25 MG tablet TAKE 1 TABLET BY MOUTH 2 TIMES DAILY. 180 tablet 3  . clopidogrel (PLAVIX) 75 MG tablet TAKE 1 TABLET (75 MG TOTAL) BY MOUTH DAILY. 90 tablet 3  . nitroGLYCERIN (NITROSTAT) 0.4 MG SL tablet Place 0.4 mg  under the tongue every 5 (five) minutes as needed for chest pain.    . pantoprazole (PROTONIX) 40 MG tablet Take 1 tablet by mouth twice a day for 2 weeks and then once daily 60 tablet 5  . RANEXA 1000 MG SR tablet TAKE 1 TABLET BY MOUTH 2 TIMES DAILY. 180 tablet 2  . venlafaxine XR (EFFEXOR-XR) 150 MG 24 hr capsule Take 150 mg by mouth.     Current Facility-Administered Medications  Medication Dose Route Frequency Provider Last Rate Last Dose  . 0.9 %  sodium chloride infusion   Intravenous Continuous Laurey Morale, MD  Allergies:   Penicillins    Social History:  The patient  reports that he has been smoking Cigarettes.  He has a 40.00 pack-year smoking history. He has never used smokeless tobacco. He reports that he drinks alcohol. He reports that he does not use drugs.   Family History:  The patient's family history includes Cancer in his paternal grandmother; Coronary artery disease in his father; Emphysema in his mother.    ROS:  Please see the history of present illness.   Otherwise, review of systems are positive for none.   All other systems are reviewed and negative.    PHYSICAL EXAM: VS:  BP 118/68   Pulse 67   Ht 5' 10.5" (1.791 m)   Wt 90.8 kg (200 lb 3.2 oz)   BMI 28.32 kg/m  , BMI Body mass index is 28.32 kg/m. GENERAL:  Well appearing HEENT: Pupils equal round and reactive, fundi not visualized, oral mucosa unremarkable NECK:  No jugular venous distention, waveform within normal limits, carotid upstroke brisk and symmetric, no bruits, no thyromegaly LUNGS:  Clear to auscultation bilaterally HEART:  RRR.  PMI not displaced or sustained,S1 and S2 within normal limits, no S3, no S4, no clicks, no rubs, no murmurs ABD:  Flat, positive bowel sounds normal in frequency in pitch, no bruits, no rebound, no guarding, no midline pulsatile mass, no hepatomegaly, no splenomegaly EXT:  2 plus pulses throughout, no edema, no cyanosis no clubbing SKIN:  No rashes no  nodules NEURO:  Cranial nerves II through XII grossly intact, motor grossly intact throughout PSYCH:  Cognitively intact, oriented to person place and time   EKG:  EKG is ordered today. The ekg ordered 09/24/16 demonstrates sinus rhyhhm.  R Iate 60 bpm. 05/13/17: Sinus rhtyhm.  Rate 67 bpm.  PVCs.  Prior septal infaract.    Lexiscan Myoview 03/22/16:  The left ventricular ejection fraction is mildly decreased (45-54%).  Nuclear stress EF: 45%.  There was no ST segment deviation noted during stress.  Defect 1: There is a medium defect of moderate severity present in the basal inferior and mid inferior location.  This is a low risk study.   Low risk stress nuclear study with prior inferior infarct and mild peri-infarct ischemia; EF 45 with mild diffuse hypokinesis; mild LVE.  Carotid ultrasound 03/25/16: 1-39% ICA stenosis bilaterally  48 hour Holter 03/22/16: 1. Occasional PVCs.  2.3 short runs of SVT, ?atrial tachycardia.   LHC 04/19/15: 30% mid LAD, 80% distal LAD, 50-60% RI, RCA stent patent with 30% in-stent restenosis, 30% distal RCA  LHC 09/25/16:  Ramus lesion, 60 %stenosed.  Mid LAD-2 lesion, 40 %stenosed.  Mid LAD-1 lesion, 40 %stenosed.  Dist LAD-2 lesion, 85 %stenosed.  Dist LAD-1 lesion, 40 %stenosed.  Mid RCA-1 lesion, 30 %stenosed.  Mid RCA-2 lesion, 25 %stenosed.  Dist RCA lesion, 20 %stenosed.  There is mild left ventricular systolic dysfunction.  LV end diastolic pressure is normal.  Recent Labs: 09/24/2016: BUN 15; Creat 1.32; Hemoglobin 17.6; Platelets 229; Potassium 4.9; Sodium 138    Lipid Panel    Component Value Date/Time   CHOL 116 (L) 03/18/2016 1118   TRIG 106 03/18/2016 1118   HDL 39 (L) 03/18/2016 1118   CHOLHDL 3.0 03/18/2016 1118   VLDL 21 03/18/2016 1118   LDLCALC 56 03/18/2016 1118      Wt Readings from Last 3 Encounters:  05/13/17 90.8 kg (200 lb 3.2 oz)  12/02/16 90.6 kg (199 lb 12.8 oz)  10/28/16 90.3 kg (  199 lb)       ASSESSMENT AND PLAN:  # Stable angina: No angina at this time.  Continue medical therapy with aspirin, carvedilol, Ranexa, atorvastatin and amlodipine.    # PVCs: Jesse Page continues to have PVCs though they are manageable on carvedilol.  # Hypertension: Well-controlled.  Continue amlodipine and carvedilol.    # Hyperlipidemia: LDL 56 03/2016.  Repeat lipids.  Continue atorvastatin.  # Tobacco abuse: Jesse Page is not ready to quit smoking.  He had some success with patches and will try again when ready.  We discussed smok  Current medicines are reviewed at length with the patient today.  The patient does not have concerns regarding medicines.  The following changes have been made:  no change  Labs/ tests ordered today include:   Orders Placed This Encounter  Procedures  . Hepatic function panel  . Lipid panel  . EKG 12-Lead     Disposition:   FU with Hoyle Barkdull C. Duke Salvia, MD, [facility] in 6 months.   This note was written with the assistance of speech recognition software.  Please excuse any transcriptional errors.  Signed, Presley Gora C. Duke Salvia, MD, [facility]  05/13/2017 2:24 PM    [facility]

## 2017-06-18 ENCOUNTER — Other Ambulatory Visit: Payer: Self-pay | Admitting: Cardiology

## 2017-06-27 IMAGING — NM NM MISC PROCEDURE
6 series · 36 of 36 positions shown · non-contrast
Comparison: none

[Series 1: rest · 6.40mm/px · 6 of 64 frames shown]
[frame 6/64]
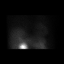
[frame 16/64]
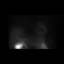
[frame 27/64]
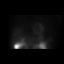
[frame 38/64]
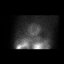
[frame 48/64]
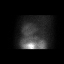
[frame 59/64]
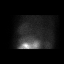

[Series 1: wbr_s-proj_st stress-gsp · 6.40mm/px · 6 of 512 frames shown]
[frame 43/512]
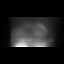
[frame 128/512]
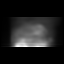
[frame 214/512]
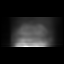
[frame 299/512]
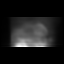
[frame 384/512]
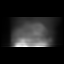
[frame 470/512]
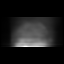

[Series 1: stress-gsp · 6.40mm/px · 6 of 511 frames shown]
[frame 43/511]
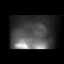
[frame 128/511]
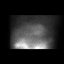
[frame 213/511]
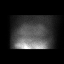
[frame 298/511]
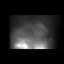
[frame 383/511]
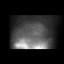
[frame 469/511]
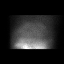

[Series 1: wbr_r-proj_st rest · 6.40mm/px · 6 of 64 frames shown]
[frame 6/64]
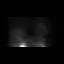
[frame 16/64]
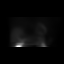
[frame 27/64]
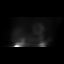
[frame 38/64]
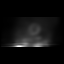
[frame 48/64]
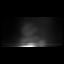
[frame 59/64]
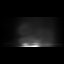

[Series 1: stress-sum-em · 6.40mm/px · 6 of 64 frames shown]
[frame 6/64]
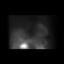
[frame 16/64]
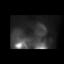
[frame 27/64]
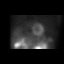
[frame 38/64]
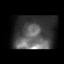
[frame 48/64]
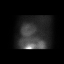
[frame 59/64]
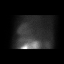

[Series 1: wbr_s-proj_st stress-sum-em · 6.40mm/px · 6 of 64 frames shown]
[frame 6/64]
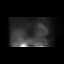
[frame 16/64]
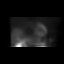
[frame 27/64]
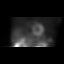
[frame 38/64]
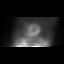
[frame 48/64]
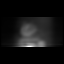
[frame 59/64]
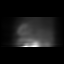

[36 of 36 positions shown; findings below may reference images not displayed]

Canned report from images found in remote index.

Refer to host system for actual result text.

## 2017-07-06 ENCOUNTER — Other Ambulatory Visit: Payer: Self-pay | Admitting: Cardiology

## 2017-07-08 NOTE — Telephone Encounter (Signed)
REFILL 

## 2017-07-17 ENCOUNTER — Other Ambulatory Visit: Payer: Self-pay

## 2017-07-17 MED ORDER — AMLODIPINE BESYLATE 2.5 MG PO TABS: 2.5000 mg | ORAL_TABLET | Freq: Two times a day (BID) | ORAL | 1 refills | Status: AC

## 2017-07-29 ENCOUNTER — Other Ambulatory Visit: Payer: Self-pay | Admitting: Cardiovascular Disease

## 2017-07-29 NOTE — Telephone Encounter (Signed)
Please review for refill, Thanks !  

## 2017-08-01 ENCOUNTER — Other Ambulatory Visit: Payer: Self-pay | Admitting: Cardiology

## 2017-10-26 ENCOUNTER — Other Ambulatory Visit: Payer: Self-pay | Admitting: Cardiovascular Disease

## 2017-10-27 NOTE — Telephone Encounter (Signed)
Refill Request.  

## 2018-12-01 ENCOUNTER — Telehealth: Payer: Self-pay | Admitting: Cardiovascular Disease

## 2018-12-01 NOTE — Telephone Encounter (Signed)
Left message to call back and schedule virtual visit with Dr. Duke Salvia
